# Patient Record
Sex: Female | Born: 1990 | Race: Black or African American | Hispanic: No | Marital: Single | Smoking: Never smoker
Health system: Southern US, Community
[De-identification: ages and names within clinical notes are randomized; demographics above are authoritative.]

## PROBLEM LIST (undated history)

## (undated) DIAGNOSIS — N390 Urinary tract infection, site not specified: Secondary | ICD-10-CM

## (undated) DIAGNOSIS — E669 Obesity, unspecified: Secondary | ICD-10-CM

## (undated) DIAGNOSIS — G43909 Migraine, unspecified, not intractable, without status migrainosus: Secondary | ICD-10-CM

## (undated) DIAGNOSIS — S60219A Contusion of unspecified wrist, initial encounter: Secondary | ICD-10-CM

## (undated) DIAGNOSIS — E66812 Obesity, class 2: Secondary | ICD-10-CM

## (undated) DIAGNOSIS — L639 Alopecia areata, unspecified: Secondary | ICD-10-CM

## (undated) DIAGNOSIS — M7660 Achilles tendinitis, unspecified leg: Secondary | ICD-10-CM

## (undated) DIAGNOSIS — L509 Urticaria, unspecified: Secondary | ICD-10-CM

## (undated) HISTORY — DX: Obesity, class 2: E66.812

## (undated) HISTORY — PX: WRIST SURGERY: SHX841

## (undated) HISTORY — DX: Obesity, unspecified: E66.9

## (undated) HISTORY — DX: Alopecia areata, unspecified: L63.9

## (undated) HISTORY — DX: Contusion of unspecified wrist, initial encounter: S60.219A

## (undated) HISTORY — DX: Achilles tendinitis, unspecified leg: M76.60

## (undated) HISTORY — PX: COLPOSCOPY: SHX161

## (undated) HISTORY — DX: Migraine, unspecified, not intractable, without status migrainosus: G43.909

## (undated) HISTORY — DX: Urinary tract infection, site not specified: N39.0

## (undated) HISTORY — DX: Urticaria, unspecified: L50.9

---

## 2017-11-01 DIAGNOSIS — Z87440 Personal history of urinary (tract) infections: Secondary | ICD-10-CM | POA: Insufficient documentation

## 2017-11-10 DIAGNOSIS — R87610 Atypical squamous cells of undetermined significance on cytologic smear of cervix (ASC-US): Secondary | ICD-10-CM | POA: Insufficient documentation

## 2019-01-15 ENCOUNTER — Emergency Department (HOSPITAL_BASED_OUTPATIENT_CLINIC_OR_DEPARTMENT_OTHER): Payer: 59

## 2019-01-15 ENCOUNTER — Emergency Department (HOSPITAL_BASED_OUTPATIENT_CLINIC_OR_DEPARTMENT_OTHER)
Admission: EM | Admit: 2019-01-15 | Discharge: 2019-01-15 | Disposition: A | Discharge status: Home / Self Care | Payer: 59 | Attending: Emergency Medicine | Admitting: Emergency Medicine

## 2019-01-15 ENCOUNTER — Encounter (HOSPITAL_BASED_OUTPATIENT_CLINIC_OR_DEPARTMENT_OTHER): Payer: Self-pay | Admitting: *Deleted

## 2019-01-15 ENCOUNTER — Other Ambulatory Visit: Payer: Self-pay

## 2019-01-15 DIAGNOSIS — M549 Dorsalgia, unspecified: Secondary | ICD-10-CM | POA: Diagnosis not present

## 2019-01-15 DIAGNOSIS — R0789 Other chest pain: Secondary | ICD-10-CM | POA: Insufficient documentation

## 2019-01-15 DIAGNOSIS — R202 Paresthesia of skin: Secondary | ICD-10-CM | POA: Diagnosis not present

## 2019-01-15 DIAGNOSIS — M25512 Pain in left shoulder: Secondary | ICD-10-CM | POA: Diagnosis not present

## 2019-01-15 DIAGNOSIS — R079 Chest pain, unspecified: Secondary | ICD-10-CM

## 2019-01-15 LAB — COMPREHENSIVE METABOLIC PANEL
ALT: 46 U/L — ABNORMAL HIGH (ref 0–44)
AST: 37 U/L (ref 15–41)
Albumin: 3.1 g/dL — ABNORMAL LOW (ref 3.5–5.0)
Alkaline Phosphatase: 106 U/L (ref 38–126)
Anion gap: 5 (ref 5–15)
BUN: 9 mg/dL (ref 6–20)
CO2: 27 mmol/L (ref 22–32)
Calcium: 8.5 mg/dL — ABNORMAL LOW (ref 8.9–10.3)
Chloride: 107 mmol/L (ref 98–111)
Creatinine, Ser: 0.64 mg/dL (ref 0.44–1.00)
GFR calc Af Amer: >60 mL/min (ref >60)
GFR calc non Af Amer: >60 mL/min (ref >60)
Glucose, Bld: 74 mg/dL (ref 70–99)
Potassium: 3.4 mmol/L — ABNORMAL LOW (ref 3.5–5.1)
Sodium: 139 mmol/L (ref 135–145)
Total Bilirubin: 0.7 mg/dL (ref 0.3–1.2)
Total Protein: 7.2 g/dL (ref 6.5–8.1)

## 2019-01-15 LAB — TROPONIN I (HIGH SENSITIVITY)
Troponin I (High Sensitivity): <2 ng/L (ref <18)
Troponin I (High Sensitivity): <2 ng/L (ref <18)

## 2019-01-15 LAB — CBC WITH DIFFERENTIAL/PLATELET
Abs Immature Granulocytes: 0.01 10*3/uL (ref 0.00–0.07)
Basophils Absolute: 0.1 10*3/uL (ref 0.0–0.1)
Basophils Relative: 1 %
Eosinophils Absolute: 0 10*3/uL (ref 0.0–0.5)
Eosinophils Relative: 1 %
HCT: 42.7 % (ref 36.0–46.0)
Hemoglobin: 13.4 g/dL (ref 12.0–15.0)
Immature Granulocytes: 0 %
Lymphocytes Relative: 54 %
Lymphs Abs: 3.2 10*3/uL (ref 0.7–4.0)
MCH: 27.8 pg (ref 26.0–34.0)
MCHC: 31.4 g/dL (ref 30.0–36.0)
MCV: 88.6 fL (ref 80.0–100.0)
Monocytes Absolute: 0.4 10*3/uL (ref 0.1–1.0)
Monocytes Relative: 7 %
Neutro Abs: 2.2 10*3/uL (ref 1.7–7.7)
Neutrophils Relative %: 37 %
Platelets: 337 10*3/uL (ref 150–400)
RBC: 4.82 MIL/uL (ref 3.87–5.11)
RDW: 14 % (ref 11.5–15.5)
WBC: 5.9 10*3/uL (ref 4.0–10.5)
nRBC: 0 % (ref 0.0–0.2)

## 2019-01-15 LAB — PREGNANCY, URINE: Preg Test, Ur: NEGATIVE

## 2019-01-15 MED ORDER — POTASSIUM CHLORIDE CRYS ER 20 MEQ PO TBCR: 20.0000 meq | EXTENDED_RELEASE_TABLET | Freq: Every day | ORAL | 0 refills | Status: DC

## 2019-01-15 MED ORDER — METHOCARBAMOL 500 MG PO TABS: 500.0000 mg | ORAL_TABLET | Freq: Three times a day (TID) | ORAL | 0 refills | Status: DC | PRN

## 2019-01-15 MED ORDER — NAPROXEN 500 MG PO TABS: 500.0000 mg | ORAL_TABLET | Freq: Two times a day (BID) | ORAL | 0 refills | Status: DC

## 2019-01-15 NOTE — ED Provider Notes (Signed)
[facility] EMERGENCY DEPARTMENT Provider Note   CSN: [id number] Arrival date & time: 01/15/19  1454     History Chief Complaint  Patient presents with  . Chest Pain    Regina Riley is a 28 y.o. female without significant past medical history who presents to the emergency department with complaints of chest pain that began at 1:30 PM.  Patient was sitting at work when she developed fairly quick onset stabbing pain to the left chest radiating to the left shoulder/left upper extremity with associated tingling to the left fingers.  States the left upper extremity felt abnormal in general.  She felt she did not want to move it.  No alleviating aggravating factors.  No change with a deep breath or with exertion.  She had an episode of similar discomfort 1 week ago during which the pain was more severe but the left upper extremity symptoms were more mild which prompted an ER visit.  Per review she was seen at [facility] emergency department 01/07/2019 for her symptoms.  CBC/CMP were performed with mildly elevated LFTs and mild hypocalcemia.  She had negative Covid testing and influenza testing.  Pregnancy test was negative.  Negative cardiac enzymes.  Positive D-dimer result with resultant CTA which was negative for pulmonary embolism or aortic dissection.  She was discharged home.  She denies dyspnea, nausea, vomiting, diaphoresis, lightheadedness, dizziness, syncope, facial asymmetry, slurred speech, lower extremity numbness/weakness, drug use, leg pain/swelling, hemoptysis, recent surgery/trauma, recent long travel,  personal hx of cancer, or hx of DVT/PE.  She does take an OCP.  She does not have any family history of early CAD.   HPI     History reviewed. No pertinent past medical history.  There are no problems to display for this patient.   History reviewed. No pertinent surgical history.   OB History   No obstetric history on file.     No family history on  file.  Social History   Tobacco Use  . Smoking status: Never Smoker  . Smokeless tobacco: Never Used  Substance Use Topics  . Alcohol use: Not Currently  . Drug use: Not Currently    Home Medications Prior to Admission medications   Not on File    Allergies    Patient has no known allergies.  Review of Systems   Review of Systems  Constitutional: Negative for chills, diaphoresis and fever.  HENT: Negative for congestion, ear pain and sore throat.   Eyes: Negative for visual disturbance.  Respiratory: Negative for cough and shortness of breath.        Negative for hemoptysis.   Cardiovascular: Positive for chest pain.  Gastrointestinal: Negative for abdominal pain, nausea and vomiting.  Musculoskeletal: Positive for arthralgias.  Neurological: Negative for dizziness, seizures, syncope, facial asymmetry, speech difficulty and headaches.       Positive for Left upper extremity paresthesias to the digits and and not feeling quite right per patient.  All other systems reviewed and are negative.   Physical Exam Updated Vital Signs BP 119/80 (BP Location: Left Arm)   Pulse 89   Temp 98.7 F (37.1 C)   Resp 18   Ht 5\' 6"  (1.676 m)   Wt 95.3 kg   LMP 01/09/2019   SpO2 100%   BMI 33.89 kg/m   Physical Exam Vitals and nursing note reviewed.  Constitutional:      General: She is not in acute distress.    Appearance: Normal appearance. She is well-developed. She is  not ill-appearing or toxic-appearing.  HENT:     Head: Normocephalic and atraumatic.  Eyes:     General:        Right eye: No discharge.        Left eye: No discharge.     Extraocular Movements: Extraocular movements intact.     Conjunctiva/sclera: Conjunctivae normal.     Pupils: Pupils are equal, round, and reactive to light.  Neck:     Comments: No midline tenderness.  Cardiovascular:     Rate and Rhythm: Normal rate and regular rhythm.     Pulses:          Radial pulses are 2+ on the right side  and 2+ on the left side.  Pulmonary:     Effort: Pulmonary effort is normal. No respiratory distress.     Breath sounds: Normal breath sounds. No wheezing, rhonchi or rales.  Chest:     Chest wall: Tenderness (Left chest wall) present.  Abdominal:     General: There is no distension.     Palpations: Abdomen is soft.     Tenderness: There is no abdominal tenderness.  Musculoskeletal:     Cervical back: Normal range of motion and neck supple. Muscular tenderness (left) present.     Right lower leg: No tenderness. No edema.     Left lower leg: No tenderness. No edema.     Comments: Upper extremities: No obvious deformity, appreciable swelling, edema, erythema, ecchymosis, warmth, or open wounds. Patient has intact AROM throughout.  Mild tenderness to the left trapezius and left deltoid region.  Skin:    General: Skin is warm and dry.     Capillary Refill: Capillary refill takes less than 2 seconds.     Findings: No rash.  Neurological:     Mental Status: She is alert.     Comments: Alert. Clear speech.  No facial droop.  CN III through XII grossly intact.  Sensation grossly intact to bilateral upper and lower extremities. 5/5 symmetric grip strength and strength with wrist flexion/extension, elbow flexion/extension, shoulder flexion/tension as well as ankle plantar/dorsiflexion.  Ambulatory.  Normal finger-to-nose.  Negative pronator drift.  Psychiatric:        Mood and Affect: Mood normal.        Behavior: Behavior normal.     ED Results / Procedures / Treatments   Labs (all labs ordered are listed, but only abnormal results are displayed) Labs Reviewed  CBC WITH DIFFERENTIAL/PLATELET  PREGNANCY, URINE  COMPREHENSIVE METABOLIC PANEL  TROPONIN I (HIGH SENSITIVITY)  TROPONIN I (HIGH SENSITIVITY)    EKG EKG Interpretation  Date/Time:  Monday January 15 2019 14:59:49 EST Ventricular Rate:  91 PR Interval:  148 QRS Duration: 72 QT Interval:  340 QTC Calculation: 418 R  Axis:   14 Text Interpretation: Sinus rhythm with marked sinus arrhythmia Minimal voltage criteria for LVH, may be normal variant ( R in aVL ) T wave abnormality, consider inferior ischemia Abnormal ECG No previous ECGs available Confirmed by Vanetta MuldersZackowski, Scott [phone]) on 01/15/2019 5:05:20 PM   Radiology DG Chest 2 View  Result Date: 01/15/2019 CLINICAL DATA:  28 year old female with chest pain. EXAM: CHEST - 2 VIEW COMPARISON:  None. FINDINGS: The lungs are clear. There is no pleural effusion or pneumothorax. The cardiac silhouette is within normal limits. No acute osseous pathology. IMPRESSION: No active cardiopulmonary disease. Electronically Signed   By: Elgie CollardArash  Radparvar M.D.   On: 01/15/2019 16:50   DG Shoulder Left  Result Date: 01/15/2019  CLINICAL DATA:  Chest pain left arm numbness EXAM: LEFT SHOULDER - 2+ VIEW COMPARISON:  None. FINDINGS: There is no evidence of fracture or dislocation. There is no evidence of arthropathy or other focal bone abnormality. Soft tissues are unremarkable. IMPRESSION: Negative. Electronically Signed   By: Marlan Palau M.D.   On: 01/15/2019 16:50    Procedures Procedures (including critical care time)  Medications Ordered in ED Medications - No data to display  ED Course  I have reviewed the triage vital signs and the nursing notes.  Pertinent labs & imaging results that were available during my care of the patient were reviewed by me and considered in my medical decision making (see chart for details).    MDM Rules/Calculators/A&P                      Patient presents to the emergency department with complaints of chest pain.  Patient is nontoxic-appearing, resting comfortably, vitals WNL.  Patient has some reproducibility of pain with left chest wall, trapezius, & deltoid palpation.  She reports some left finger paresthesias and abnormal feeling to the left upper extremity, she has a normal neurologic exam without focal deficits, do not suspect  acute CVA.  Plan for labs, cardiac monitor, and chest x-ray.  Pregnancy test: Negative CBC: No leukocytosis or anemia CMP: Mild hypocalcemia/hypokalemia- no significant derangement.  High-sensitivity troponin: Less than 2, less than 2 AYO:KHTXH rhythm with marked sinus arrhythmia Minimal voltage criteria for LVH, may be normal variant ( R in aVL ) T wave abnormality, consider inferior ischemia Abnormal ECG No previous ECGs available- no STEMI CXR/L shoulder xray: no acute abnormalities  Patient is low risk [place], she does not have a tearing sensation type pain, she has symmetric pulses, she has no neuro deficits, she had a recent CT angio which was negative for pulmonary embolism or dissection with a similar presentation, do not suspect either of these etiologies as the cause of her symptoms.  EKG without a STEMI, her delta troponin is without significant elevation, do not suspect ACS.  No imaging or laboratory abnormalities to explain her discomfort.  Unclear definitive etiology.  This may be muscular with reproducibility will trial muscle relaxant and naproxen, but unclear definitive etiology. She has an appointment with a cardiologist tomorrow- recommended keeping this for follow up.. I discussed results, treatment plan, need for follow-up, and return precautions with the patient. Provided opportunity for questions, patient confirmed understanding and is in agreement with plan.   Findings and plan of care discussed with supervising physician Dr. Deretha Emory who is in agreement.   Final Clinical Impression(s) / ED Diagnoses Final diagnoses:  Chest pain, unspecified type    Rx / DC Orders ED Discharge Orders         Ordered    naproxen (NAPROSYN) 500 MG tablet  2 times daily     01/15/19 1909    methocarbamol (ROBAXIN) 500 MG tablet  Every 8 hours PRN     01/15/19 1909    potassium chloride SA (KLOR-CON) 20 MEQ tablet  Daily     01/15/19 1909           Desmond Lope 01/15/19 1915    Vanetta Mulders, MD 01/18/19 [phone]

## 2019-01-15 NOTE — ED Triage Notes (Addendum)
C/o CP x 1 week , seen at [facility] ED last week for same , CTA neg

## 2019-01-15 NOTE — Discharge Instructions (Addendum)
You were seen in the emergency department today for chest pain. Your work-up in the emergency department has been overall reassuring. Your labs have been fairly normal and or similar to previous blood work you have had done, your potassium and calcium returned mildly low, please see attached diet guidelines, we are sending you home with a few days of a potassium supplement.. Your EKG and the enzyme we use to check your heart did not show an acute heart attack at this time. Your chest x-ray was normal.   We are unsure the exact cause of your chest discomfort.  We are sending you home with medicines to potentially help with a musculoskeletal cause.  - Naproxen is a nonsteroidal anti-inflammatory medication that will help with pain and swelling. Be sure to take this medication as prescribed with food, 1 pill every 12 hours,  It should be taken with food, as it can cause stomach upset, and more seriously, stomach bleeding. Do not take other nonsteroidal anti-inflammatory medications with this such as Advil, Motrin, Aleve, Mobic, Goodie Powder, or Motrin.    - Robaxin is the muscle relaxer I have prescribed, this is meant to help with muscle tightness. Be aware that this medication may make you drowsy therefore the first time you take this it should be at a time you are in an environment where you can rest. Do not drive or operate heavy machinery when taking this medication. Do not drink alcohol or take other sedating medications with this medicine such as narcotics or benzodiazepines.   You make take Tylenol per over the counter dosing with these medications.   We have prescribed you new medication(s) today. Discuss the medications prescribed today with your pharmacist as they can have adverse effects and interactions with your other medicines including over the counter and prescribed medications. Seek medical evaluation if you start to experience new or abnormal symptoms after taking one of these medicines,  seek care immediately if you start to experience difficulty breathing, feeling of your throat closing, facial swelling, or rash as these could be indications of a more serious allergic reaction  Please follow-up with the cardiologist appointment you have tomorrow as scheduled, we have also provided our cardiology group on-call.  Return to the ER immediately should you experience any new or worsening symptoms including but not limited to return of pain, worsened pain, vomiting, shortness of breath, dizziness, lightheadedness, passing out, or any other concerns that you may have.

## 2019-02-02 ENCOUNTER — Ambulatory Visit (INDEPENDENT_AMBULATORY_CARE_PROVIDER_SITE_OTHER): Payer: 59 | Admitting: Family Medicine

## 2019-02-02 ENCOUNTER — Encounter: Payer: Self-pay | Admitting: Family Medicine

## 2019-02-02 ENCOUNTER — Other Ambulatory Visit: Payer: Self-pay

## 2019-02-02 VITALS — BP 132/84 | HR 100 | Temp 97.6°F | Ht 66.0 in | Wt 218.0 lb

## 2019-02-02 DIAGNOSIS — R0789 Other chest pain: Secondary | ICD-10-CM | POA: Diagnosis not present

## 2019-02-02 LAB — CBC
HCT: 41.3 % (ref 36.0–46.0)
Hemoglobin: 13.5 g/dL (ref 12.0–15.0)
MCHC: 32.8 g/dL (ref 30.0–36.0)
MCV: 86.7 fl (ref 78.0–100.0)
Platelets: 369 10*3/uL (ref 150.0–400.0)
RBC: 4.76 Mil/uL (ref 3.87–5.11)
RDW: 14.3 % (ref 11.5–15.5)
WBC: 5.2 10*3/uL (ref 4.0–10.5)

## 2019-02-02 LAB — COMPREHENSIVE METABOLIC PANEL
ALT: 31 U/L (ref 0–35)
AST: 24 U/L (ref 0–37)
Albumin: 3.7 g/dL (ref 3.5–5.2)
Alkaline Phosphatase: 108 U/L (ref 39–117)
BUN: 11 mg/dL (ref 6–23)
CO2: 27 mEq/L (ref 19–32)
Calcium: 8.9 mg/dL (ref 8.4–10.5)
Chloride: 107 mEq/L (ref 96–112)
Creatinine, Ser: 0.6 mg/dL (ref 0.40–1.20)
GFR: 141.93 mL/min (ref >60.00)
Glucose, Bld: 82 mg/dL (ref 70–99)
Potassium: 3.8 mEq/L (ref 3.5–5.1)
Sodium: 142 mEq/L (ref 135–145)
Total Bilirubin: 0.7 mg/dL (ref 0.2–1.2)
Total Protein: 7 g/dL (ref 6.0–8.3)

## 2019-02-02 LAB — T4, FREE: Free T4: 0.89 ng/dL (ref 0.60–1.60)

## 2019-02-02 LAB — TSH: TSH: 1.81 u[IU]/mL (ref 0.35–4.50)

## 2019-02-02 MED ORDER — HYDROXYZINE HCL 25 MG PO TABS: 25.0000 mg | ORAL_TABLET | Freq: Three times a day (TID) | ORAL | 0 refills | Status: DC | PRN

## 2019-02-02 NOTE — Patient Instructions (Addendum)
Give [place] 2-3 business days to get the results of your labs back.   If you do not hear anything about your referral in the next 1-2 weeks, call our office and ask for an update.  Try heat, ice, and ibuprofen when/if this happens again. If this helps, this would suggest a musculoskeletal cause.  I am also sending in a medicine to take that would treat an anxiety/panic attack cause.  Let us know if you need anything.  Coping skills Choose 5 that work for you:  Take a deep breath  Count to 20  Read a book  Do a puzzle  Meditate  Bake  Sing  Knit  Garden  Pray  Go outside  Call a friend  Listen to music  Take a walk  Color  Send a note  Take a bath  Watch a movie  Be alone in a quiet place  Pet an animal  Visit a friend  Journal  Exercise  Stretch

## 2019-02-02 NOTE — Progress Notes (Signed)
Chief Complaint  Patient presents with  . New Patient (Initial Visit)       New Patient Visit SUBJECTIVE: HPI: Regina Riley is an 29 y.o.female who is being seen for establishing care.  The patient was seen by GYN only.   Chest pain:  Duration of issue: 4 months  L side of chest Most recent episode was 7 hrs that did not wax or wane earlier in the week Had 2 other episodes where she went to ED. CTA neg for PE, trops neg, EKG showed sinus arrhythmia, no other abn.  Went to see cards who told her nothing is wrong, no testing done.  Quality: sharp Palliation: none Provocation: none Severity: 10/10 when it happens Radiation: none Duration of chest pain: several hrs Associated symptoms: had L arm numbness once; BP and HR elevated No a/w meals, position, denies rashes/bruising/swelling. Cardiac history: none Family heart history: Yes, no early symptoms in family Smoker? No   No past medical history on file. No past surgical history on file. Family History  Problem Relation Age of Onset  . Asthma Mother   . Hypertension Father   . Hyperlipidemia Father   . Diabetes Father   . Heart attack Maternal Grandfather 60  . Heart disease Paternal Grandfather    No Known Allergies  Current Outpatient Medications:  .  norethindrone-ethinyl estradiol (LOESTRIN 1/20, 21,) 1-20 MG-MCG tablet, Take 1 tablet by mouth daily., Disp: , Rfl:  .  hydrOXYzine (ATARAX/VISTARIL) 25 MG tablet, Take 1-3 tablets (25-75 mg total) by mouth 3 (three) times daily as needed for anxiety., Disp: 60 tablet, Rfl: 0  Patient's last menstrual period was 01/31/2019 (exact date).  ROS Cardiovascular: Denies current chest pain  Respiratory: Denies dyspnea   OBJECTIVE: BP 132/84 (BP Location: Left Arm, Patient Position: Sitting, Cuff Size: Normal)   Pulse 100   Temp 97.6 F (36.4 C) (Temporal)   Ht 5' 6"  (1.676 m)   Wt 218 lb (98.9 kg)   LMP 01/31/2019 (Exact Date)   SpO2 96%   BMI 35.19 kg/m   General:  well developed, well nourished, in no apparent distress Skin:  no significant moles, warts, or growths Nose:  nares patent, septum midline, mucosa normal Throat/Pharynx:  lips and gingiva without lesion; tongue and uvula midline; non-inflamed pharynx; no exudates or postnasal drainage Lungs:  clear to auscultation, breath sounds equal bilaterally, no respiratory distress Cardio:  regular rate and rhythm, no LE edema or bruits Musculoskeletal:  Chest pain is not reproducible to palpation Neuro:  gait normal Psych: well oriented with normal range of affect and appropriate judgment/insight  ASSESSMENT/PLAN: Atypical chest pain - Plan: CBC, Comp Met (CMET), TSH, T4, free, Ambulatory referral to Cardiology, hydrOXYzine (ATARAX/VISTARIL) 25 MG tablet  Refer to cards for reassurance. The other office was going to do a stress test, but she did not want to continue to see them as she had a bad experience. Will set her up w a [facility] provider. Ck labs, hx of elevated Hb and hypoK. Famhx and personal medhx reassuring overall. Suggested she try some anxiety relieving techniques and Atarax should this happen again. Could also try ice/heat, Tylenol, ibuprofen.  Patient should return for CPE at convenience. The patient voiced understanding and agreement to the plan.   [place], DO 02/02/19  12:10 PM

## 2019-02-06 ENCOUNTER — Encounter: Payer: Self-pay | Admitting: Family Medicine

## 2019-02-07 NOTE — Progress Notes (Signed)
Cardiology Office Note:    Date:  02/08/2019   ID:  [name], DOB 02/16/1990, MRN [id number]  PCP:  Shelda Pal, DO  Cardiologist:  Shirlee More, MD   Referring MD: Shelda Pal*  ASSESSMENT:    1. Chest pain of uncertain etiology   2. Positive D dimer    PLAN:    In order of problems listed above:  1. 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  Further evaluation duplex lower extremities with elevated D-dimer and oral contraceptives initiation of a nonsteroidal anti-inflammatory drug and echocardiogram.  I will see back in the office in 1 month.  If she has evidence of DVT she need to stop oral contraceptives.  Next appointment 1 month   Medication Adjustments/Labs and Tests Ordered: Current medicines are reviewed at length with the patient today.  Concerns regarding medicines are outlined above.  No orders of the defined types were placed in this encounter.  No orders of the defined types were placed in this encounter.    Chief Complaint  Patient presents with  . Chest Pain    With 2 recent ED visits.    History of Present Illness:    Regina Riley is a 29 y.o. female who is being seen today for the evaluation of chest pain at the request of Shelda Pal*.  She was seen in the emergency room at [facility] 01/07/2019 and [place] 01/15/2019 both times for chest pain and both evaluation showed no evidence of acute coronary syndrome with initial and repeat troponins on each visit.  Seen by [facility] cardiology advised to have a stress echocardiogram on chart review she was seen previously in 2010 with chest pain in the ED at [place]   3 wk ago  (01/15/19) 3 wk ago  (01/15/19)   Troponin I (High Sensitivity) <18 ng/L <2  <2 CM    EKG 01/16/2019 [place] T wave abnormality K 3.4 EKG 01/07/2019 as similarly described I cannot personally review it in care  everywhere.  Her potassium 01/07/2019 was 4.5  Chest CTA 01/07/2019 performed with elevated D-dimer in the emergency room at [facility] Showed normal cardiovascular structures there is no aortic or coronary artery calcification noted no aortopathy. FINDINGS: No evidence for pulmonary embolism or aortic dissection. The trachea and mainstem bronchi are patent. The lung bases are remarkable for dependent bilateral lower lobe subsegmental atelectasis. Normal heart size. Trace left pleural effusion.  Negative upper abdomen.  SARS COVID 19 negative 01/07/2019.  Subsequently informed by office of a follow-up positive test more than 2 weeks ago not requiring hospitalization.  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  She has had 2 ED visits with chest pain that is localized left sternal area sharp nonexertional and relieved with rest lasting for hours both times and relieved when she took ibuprofen on her own.  She has had symptoms off and on for at least 3 months and was seen once in the emergency room 10 years ago with a similar phenomenon.  He has no shortness of breath she takes oral contraceptive her D-dimer is elevated she had a CTA of the chest but did not have her legs duplex she is concerned she could be at risk for deep vein thrombosis and will be performed and if abnormal would need to stop oral contraceptive therapy.  I do not think she requires an ischemia evaluation her risk  is quite low and what organ to do is an echocardiogram to exclude problems like pericarditis or structural heart disease initiate therapy with a nonsteroidal anti-inflammatory drug I told her I expect a prompt quick improvement and she continues to have symptoms physical therapy modalities with iontophoresis of high potency steroid can be quite helpful.  Her symptoms are not anginal she has no known history of heart disease congenital rheumatic or murmur.  Is not had  palpitation or syncope and despite the abnormal COVID-19 test has not had any respiratory symptoms fever or chills.  His pain was not pleuritic no associated GI symptoms or diaphoresis she did have paresthesia in her left arm with the second episode.  The chest pain was localized and sharp.  Past Medical History:  Diagnosis Date  . Achilles tendinitis   . Alopecia areata   . Obesity, Class II, BMI 35-39.9   . Urticaria   . UTI (urinary tract infection)   . Wrist contusion     Past Surgical History:  Procedure Laterality Date  . COLPOSCOPY    . WRIST SURGERY      Current Medications: Current Meds  Medication Sig  . hydrOXYzine (ATARAX/VISTARIL) 25 MG tablet Take 1-3 tablets (25-75 mg total) by mouth 3 (three) times daily as needed for anxiety.  . norethindrone-ethinyl estradiol (LOESTRIN 1/20, 21,) 1-20 MG-MCG tablet Take 1 tablet by mouth daily.     Allergies:   Patient has no known allergies.   Social History   Socioeconomic History  . Marital status: Single    Spouse name: Not on file  . Number of children: Not on file  . Years of education: Not on file  . Highest education level: Not on file  Occupational History  . Not on file  Tobacco Use  . Smoking status: Never Smoker  . Smokeless tobacco: Never Used  Substance and Sexual Activity  . Alcohol use: Not Currently  . Drug use: Not Currently  . Sexual activity: Not on file  Other Topics Concern  . Not on file  Social History Narrative  . Not on file   Social Determinants of Health   Financial Resource Strain:   . Difficulty of Paying Living Expenses: Not on file  Food Insecurity:   . Worried About Programme researcher, broadcasting/film/video in the Last Year: Not on file  . Ran Out of Food in the Last Year: Not on file  Transportation Needs:   . Lack of Transportation (Medical): Not on file  . Lack of Transportation (Non-Medical): Not on file  Physical Activity:   . Days of Exercise per Week: Not on file  . Minutes of Exercise  per Session: Not on file  Stress:   . Feeling of Stress : Not on file  Social Connections:   . Frequency of Communication with Friends and Family: Not on file  . Frequency of Social Gatherings with Friends and Family: Not on file  . Attends Religious Services: Not on file  . Active Member of Clubs or Organizations: Not on file  . Attends Banker Meetings: Not on file  . Marital Status: Not on file     Family History: The patient's family history includes Asthma in her mother; Diabetes in her father; Heart attack (age of onset: 3) in her maternal grandfather; Heart disease in her paternal grandfather; Hyperlipidemia in her father; Hypertension in her father.  ROS:   Review of Systems  Constitution: Negative.  HENT: Negative.   Eyes: Negative.  Cardiovascular: Positive for chest pain.  Respiratory: Negative.   Endocrine: Negative.   Hematologic/Lymphatic: Negative.   Skin: Negative.   Musculoskeletal: Negative.   Gastrointestinal: Negative.   Genitourinary: Negative.   Neurological: Positive for paresthesias.  Psychiatric/Behavioral: Negative.   Allergic/Immunologic: Negative.    Please see the history of present illness.     All other systems reviewed and are negative.  EKGs/Labs/Other Studies Reviewed:    The following studies were reviewed today: Reviewed extensive to ED visits and ED visit in 2010 with the patient during the visit  EKG:  EKG is  ordered today.  The ekg ordered today is personally reviewed and demonstrates unspecific T wave  Recent Labs: 02/02/2019: ALT 31; BUN 11; Creatinine, Ser 0.60; Hemoglobin 13.5; Platelets 369.0; Potassium 3.8; Sodium 142; TSH 1.81  Recent Lipid Panel 11/01/2017 cholesterol 160 triglyceride 45 LDL 108 HDL 43  Physical Exam:    VS:  BP 108/78   Pulse 71   Ht 5\' 6"  (1.676 m)   Wt 216 lb (98 kg)   LMP 01/31/2019 (Exact Date)   SpO2 96%   BMI 34.86 kg/m     Wt Readings from Last 3 Encounters:  02/08/19 216 lb  (98 kg)  02/02/19 218 lb (98.9 kg)  01/15/19 210 lb (95.3 kg)     GEN:  Well nourished, well developed in no acute distress HEENT: Normal NECK: No JVD; No carotid bruits LYMPHATICS: No lymphadenopathy CARDIAC: Mild left costochondral junction tenderness to exam RRR, no murmurs, rubs, gallops RESPIRATORY:  Clear to auscultation without rales, wheezing or rhonchi  ABDOMEN: Soft, non-tender, non-distended MUSCULOSKELETAL:  No edema; No deformity  SKIN: Warm and dry NEUROLOGIC:  Alert and oriented x 3 PSYCHIATRIC:  Normal affect     Signed, 01/17/19, MD  02/08/2019 10:10 AM    [facility]

## 2019-02-08 ENCOUNTER — Encounter: Payer: Self-pay | Admitting: Cardiology

## 2019-02-08 ENCOUNTER — Ambulatory Visit (INDEPENDENT_AMBULATORY_CARE_PROVIDER_SITE_OTHER): Payer: 59 | Admitting: Cardiology

## 2019-02-08 ENCOUNTER — Other Ambulatory Visit: Payer: Self-pay

## 2019-02-08 VITALS — BP 108/78 | HR 71 | Ht 66.0 in | Wt 216.0 lb

## 2019-02-08 DIAGNOSIS — R079 Chest pain, unspecified: Secondary | ICD-10-CM

## 2019-02-08 DIAGNOSIS — R7989 Other specified abnormal findings of blood chemistry: Secondary | ICD-10-CM | POA: Diagnosis not present

## 2019-02-08 MED ORDER — CELECOXIB 100 MG PO CAPS: 100.0000 mg | ORAL_CAPSULE | Freq: Two times a day (BID) | ORAL | 2 refills | Status: DC

## 2019-02-08 NOTE — Patient Instructions (Signed)
Medication Instructions:  Start: Celebrex 100 MG Twice daily with food for 2 weeks. Then take 1 capsule as needed.   *If you need a refill on your cardiac medications before your next appointment, please call your pharmacy*  Lab Work: None  If you have labs (blood work) drawn today and your tests are completely normal, you will receive your results only by: Marland Kitchen MyChart Message (if you have MyChart) OR . A paper copy in the mail If you have any lab test that is abnormal or we need to change your treatment, we will call you to review the results.  Testing/Procedures: Your physician has requested that you have an echocardiogram. Echocardiography is a painless test that uses sound waves to create images of your heart. It provides your doctor with information about the size and shape of your heart and how well your heart's chambers and valves are working. This procedure takes approximately one hour. There are no restrictions for this procedure.  You will be scheduled for a bilateral vascular ultrasound of the lower extremity.    Follow-Up: At [facility], you and your health needs are our priority.  As part of our continuing mission to provide you with exceptional heart care, we have created designated Provider Care Teams.  These Care Teams include your primary Cardiologist (physician) and Advanced Practice Providers (APPs -  Physician Assistants and Nurse Practitioners) who all work together to provide you with the care you need, when you need it.  Your next appointment:   4 week(s)  The format for your next appointment:   In Person  Provider:   Norman Herrlich, MD  Other Instructions None

## 2019-02-15 ENCOUNTER — Ambulatory Visit (HOSPITAL_BASED_OUTPATIENT_CLINIC_OR_DEPARTMENT_OTHER)
Admission: RE | Admit: 2019-02-15 | Discharge: 2019-02-15 | Disposition: A | Discharge status: Home / Self Care | Payer: 59 | Source: Ambulatory Visit | Attending: Cardiology | Admitting: Cardiology

## 2019-02-15 ENCOUNTER — Other Ambulatory Visit: Payer: Self-pay

## 2019-02-15 DIAGNOSIS — R7989 Other specified abnormal findings of blood chemistry: Secondary | ICD-10-CM | POA: Diagnosis not present

## 2019-02-15 DIAGNOSIS — R079 Chest pain, unspecified: Secondary | ICD-10-CM

## 2019-02-15 NOTE — Progress Notes (Signed)
  Echocardiogram 2D Echocardiogram has been performed.  Regina Riley 02/15/2019, 10:16 AM

## 2019-02-15 NOTE — Progress Notes (Signed)
VAS [place] LOWER EXTREMITY VENOUS BILAT (DVT)    02/15/19 Sinda Du RDCS, RVT

## 2019-03-07 NOTE — Progress Notes (Signed)
Cardiology Office Note:    Date:  03/08/2019   ID:  Regina Riley, DOB [birth year], MRN [id number]  PCP:  Shelda Pal, DO  Cardiologist:  Shirlee More, MD    Referring MD: Shelda Pal*    ASSESSMENT:    1. Chest pain of uncertain etiology   2. Positive D dimer    PLAN:    In order of problems listed above:  1. Clinically she had costochondral pain syndrome resolved with nonsteroidal she can stop taking Celebrex she can use a as needed or over-the-counter Aleve or ibuprofen.  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 Elevated D-dimer is not associated with venous thromboembolism. 3. Infrequent palpitation, encouraged her to purchase the iPhone adapter and if she captures arrhythmia to send it to me in my chart   Next appointment: As needed   Medication Adjustments/Labs and Tests Ordered: Current medicines are reviewed at length with the patient today.  Concerns regarding medicines are outlined above.  No orders of the defined types were placed in this encounter.  No orders of the defined types were placed in this encounter.   Chief Complaint  Patient presents with  . Follow-up    After testing    History of Present Illness:    Regina Riley is a 29 y.o. female with a hx of chest pain last seen 02/08/2019.  Her chest pain appeared to be costochondral musculoskeletal and was treated with a nonsteroidal anti-inflammatory drug.. Compliance with diet, lifestyle and medications: Yes  Lower extremity venous duplex study performed 02/15/2019 showed no evidence of deep vein or superficial thrombophlebitis in either lower extremity.  Echocardiogram 02/15/2019 showed normal left ventricular right ventricular size and function there is no significant valvular abnormality and the pericardium is normal.  Fortunately she is markedly improved with Celebrex chest pain is resolved.  She is reassured by the findings of lower extremity duplex  because she had an elevated D-dimer and the echocardiogram is reassuring.  She had one episode where her heart was rapid heart rate of 130 and she took Vistaril and it resolved.  She does not think it was emotionally induced.  We talked about applying an event monitor but it is rare that she has episodes I encouraged her if she is having them to purchase the iPhone adapter and she could send strips to me through my chart.  I am just hesitant to more order more testing on her now that the problem she saw me for is resolved she is not having chest pain shortness of breath or syncope Past Medical History:  Diagnosis Date  . Achilles tendinitis   . Alopecia areata   . Obesity, Class II, BMI 35-39.9   . Urticaria   . UTI (urinary tract infection)   . Wrist contusion     Past Surgical History:  Procedure Laterality Date  . COLPOSCOPY    . WRIST SURGERY      Current Medications: Current Meds  Medication Sig  . celecoxib (CELEBREX) 100 MG capsule Take 1 capsule (100 mg total) by mouth 2 (two) times daily. Take 1 capsule (100 MG total) by mouth 2 (two) times daily with food for 2 weeks. Then take 1 capsule (100 MG total) as needed.  . hydrOXYzine (ATARAX/VISTARIL) 25 MG tablet Take 1-3 tablets (25-75 mg total) by mouth 3 (three) times daily as needed for anxiety.  . norethindrone-ethinyl estradiol (LOESTRIN 1/20, 21,) 1-20 MG-MCG tablet Take 1 tablet by mouth daily.  Allergies:   Patient has no known allergies.   Social History   Socioeconomic History  . Marital status: Single    Spouse name: Not on file  . Number of children: Not on file  . Years of education: Not on file  . Highest education level: Not on file  Occupational History  . Not on file  Tobacco Use  . Smoking status: Never Smoker  . Smokeless tobacco: Never Used  Substance and Sexual Activity  . Alcohol use: Not Currently  . Drug use: Not Currently  . Sexual activity: Yes    Birth control/protection: Pill  Other  Topics Concern  . Not on file  Social History Narrative  . Not on file   Social Determinants of Health   Financial Resource Strain:   . Difficulty of Paying Living Expenses: Not on file  Food Insecurity:   . Worried About Programme researcher, broadcasting/film/video in the Last Year: Not on file  . Ran Out of Food in the Last Year: Not on file  Transportation Needs:   . Lack of Transportation (Medical): Not on file  . Lack of Transportation (Non-Medical): Not on file  Physical Activity:   . Days of Exercise per Week: Not on file  . Minutes of Exercise per Session: Not on file  Stress:   . Feeling of Stress : Not on file  Social Connections:   . Frequency of Communication with Friends and Family: Not on file  . Frequency of Social Gatherings with Friends and Family: Not on file  . Attends Religious Services: Not on file  . Active Member of Clubs or Organizations: Not on file  . Attends Banker Meetings: Not on file  . Marital Status: Not on file     Family History: The patient's family history includes Asthma in her mother; Diabetes in her father; Heart attack (age of onset: 104) in her maternal grandfather; Heart disease in her paternal grandfather; Hyperlipidemia in her father; Hypertension in her father. ROS:   Please see the history of present illness.    All other systems reviewed and are negative.  EKGs/Labs/Other Studies Reviewed:    The following studies were reviewed today:   Recent Labs: 02/02/2019: ALT 31; BUN 11; Creatinine, Ser 0.60; Hemoglobin 13.5; Platelets 369.0; Potassium 3.8; Sodium 142; TSH 1.81  Recent Lipid Panel No results found for: CHOL, TRIG, HDL, CHOLHDL, VLDL, LDLCALC, LDLDIRECT  Physical Exam:    VS:  BP 104/68   Pulse 80   Temp 97.7 F (36.5 C)   Ht 5\' 6"  (1.676 m)   Wt 219 lb (99.3 kg)   SpO2 98%   BMI 35.35 kg/m     Wt Readings from Last 3 Encounters:  03/08/19 219 lb (99.3 kg)  02/08/19 216 lb (98 kg)  02/02/19 218 lb (98.9 kg)     GEN:   Well nourished, well developed in no acute distress HEENT: Normal NECK: No JVD; No carotid bruits LYMPHATICS: No lymphadenopathy CARDIAC: Chest wall tenderness RRR, no murmurs, rubs, gallops RESPIRATORY:  Clear to auscultation without rales, wheezing or rhonchi  ABDOMEN: Soft, non-tender, non-distended MUSCULOSKELETAL:  No edema; No deformity  SKIN: Warm and dry NEUROLOGIC:  Alert and oriented x 3 PSYCHIATRIC:  Normal affect    Signed, 04/02/19, MD  03/08/2019 10:30 AM    [facility]

## 2019-03-08 ENCOUNTER — Encounter: Payer: Self-pay | Admitting: Cardiology

## 2019-03-08 ENCOUNTER — Ambulatory Visit (INDEPENDENT_AMBULATORY_CARE_PROVIDER_SITE_OTHER): Payer: 59 | Admitting: Cardiology

## 2019-03-08 ENCOUNTER — Other Ambulatory Visit: Payer: Self-pay

## 2019-03-08 VITALS — BP 104/68 | HR 80 | Temp 97.7°F | Ht 66.0 in | Wt 219.0 lb

## 2019-03-08 DIAGNOSIS — R079 Chest pain, unspecified: Secondary | ICD-10-CM

## 2019-03-08 DIAGNOSIS — R7989 Other specified abnormal findings of blood chemistry: Secondary | ICD-10-CM

## 2019-03-08 NOTE — Patient Instructions (Addendum)
KardiaMobile [email or web address]        FDA-cleared, clinical grade mobile EKG monitor: Lourena Simmonds is the most clinically-validated mobile EKG used by the world's leading cardiac care medical professionals With Basic service, know instantly if your heart rhythm is normal or if atrial fibrillation is detected, and email the last single EKG recording to yourself or your doctor Premium service, available for purchase through the Kardia app for $9.99 per month or $99 per year, includes unlimited history and storage of your EKG recordings, a monthly EKG summary report to share with your doctor, along with the ability to track your blood pressure, activity and weight Includes one KardiaMobile phone clip FREE SHIPPING: Standard delivery 1-3 business days. Orders placed by 11:00am PST will ship that afternoon. Otherwise, will ship next business day. All orders ship via [employer] from [place], [place]    Your physician recommends that you schedule a follow-up appointment in: AS NEEDED CALL IF RAPID HR    Your physician recommends that you continue on your current medications as directed. Please refer to the Current Medication list given to you today. MAY TAKE CELEBREX  AS NEEDED

## 2019-04-25 ENCOUNTER — Other Ambulatory Visit: Payer: Self-pay

## 2019-04-26 ENCOUNTER — Ambulatory Visit (INDEPENDENT_AMBULATORY_CARE_PROVIDER_SITE_OTHER): Payer: 59 | Admitting: Family Medicine

## 2019-04-26 ENCOUNTER — Encounter: Payer: Self-pay | Admitting: Family Medicine

## 2019-04-26 ENCOUNTER — Other Ambulatory Visit: Payer: Self-pay

## 2019-04-26 VITALS — BP 120/80 | HR 72 | Temp 95.7°F | Ht 66.0 in | Wt 219.4 lb

## 2019-04-26 DIAGNOSIS — Z111 Encounter for screening for respiratory tuberculosis: Secondary | ICD-10-CM | POA: Diagnosis not present

## 2019-04-26 DIAGNOSIS — Z0000 Encounter for general adult medical examination without abnormal findings: Secondary | ICD-10-CM

## 2019-04-26 DIAGNOSIS — Z0184 Encounter for antibody response examination: Secondary | ICD-10-CM

## 2019-04-26 LAB — COMPREHENSIVE METABOLIC PANEL
ALT: 29 U/L (ref 0–35)
AST: 21 U/L (ref 0–37)
Albumin: 3.6 g/dL (ref 3.5–5.2)
Alkaline Phosphatase: 98 U/L (ref 39–117)
BUN: 12 mg/dL (ref 6–23)
CO2: 25 mEq/L (ref 19–32)
Calcium: 8.6 mg/dL (ref 8.4–10.5)
Chloride: 107 mEq/L (ref 96–112)
Creatinine, Ser: 0.61 mg/dL (ref 0.40–1.20)
GFR: 139.04 mL/min (ref >60.00)
Glucose, Bld: 81 mg/dL (ref 70–99)
Potassium: 4.1 mEq/L (ref 3.5–5.1)
Sodium: 140 mEq/L (ref 135–145)
Total Bilirubin: 0.5 mg/dL (ref 0.2–1.2)
Total Protein: 6.6 g/dL (ref 6.0–8.3)

## 2019-04-26 LAB — CBC
HCT: 42.7 % (ref 36.0–46.0)
Hemoglobin: 14 g/dL (ref 12.0–15.0)
MCHC: 32.8 g/dL (ref 30.0–36.0)
MCV: 88 fl (ref 78.0–100.0)
Platelets: 379 10*3/uL (ref 150.0–400.0)
RBC: 4.85 Mil/uL (ref 3.87–5.11)
RDW: 14.3 % (ref 11.5–15.5)
WBC: 6.3 10*3/uL (ref 4.0–10.5)

## 2019-04-26 LAB — LIPID PANEL
Cholesterol: 173 mg/dL (ref 0–200)
HDL: 43.2 mg/dL (ref >39.00)
LDL Cholesterol: 122 mg/dL — ABNORMAL HIGH (ref 0–99)
NonHDL: 130.02
Total CHOL/HDL Ratio: 4
Triglycerides: 38 mg/dL (ref 0.0–149.0)
VLDL: 7.6 mg/dL (ref 0.0–40.0)

## 2019-04-26 NOTE — Patient Instructions (Addendum)
Give [place] 2-3 business days to get the results of your labs back.   Keep the diet clean and stay active.  We will notify you when your forms are ready.  Let us know if you need anything.

## 2019-04-26 NOTE — Progress Notes (Signed)
Chief Complaint  Patient presents with  . Annual Exam     Well Woman Regina Riley is here for a complete physical.   Her last physical was >1 year ago.  Current diet: in general, diet is OK. Current exercise: some walking. Contraception? Yes Seatbelt? Yes  Health Maintenance Pap/HPV- No Tetanus- Yes HIV screening- Yes  Past Medical History:  Diagnosis Date  . Achilles tendinitis   . Alopecia areata   . Obesity, Class II, BMI 35-39.9   . Urticaria   . UTI (urinary tract infection)   . Wrist contusion      Past Surgical History:  Procedure Laterality Date  . COLPOSCOPY    . WRIST SURGERY     Medications  Current Outpatient Medications on File Prior to Visit  Medication Sig Dispense Refill  . celecoxib (CELEBREX) 100 MG capsule Take 1 capsule (100 mg total) by mouth 2 (two) times daily. Take 1 capsule (100 MG total) by mouth 2 (two) times daily with food for 2 weeks. Then take 1 capsule (100 MG total) as needed. 90 capsule 2  . hydrOXYzine (ATARAX/VISTARIL) 25 MG tablet Take 1-3 tablets (25-75 mg total) by mouth 3 (three) times daily as needed for anxiety. 60 tablet 0  . nitrofurantoin, macrocrystal-monohydrate, (MACROBID) 100 MG capsule Take 100 mg by mouth 2 (two) times daily.    . norethindrone-ethinyl estradiol (LOESTRIN 1/20, 21,) 1-20 MG-MCG tablet Take 1 tablet by mouth daily.     Allergies No Known Allergies  Review of Systems: Constitutional:  no unexpected weight changes Eye:  no recent significant change in vision Ear/Nose/Mouth/Throat:  Ears:  no tinnitus or vertigo and no recent change in hearing Nose/Mouth/Throat:  no complaints of nasal congestion, no sore throat Cardiovascular: no chest pain Respiratory:  no cough and no shortness of breath Gastrointestinal:  no abdominal pain, no change in bowel habits GU:  Female: Getting over UTI, feeling better on abx Musculoskeletal/Extremities:  no pain of the joints Integumentary (Skin/Breast):  no  abnormal skin lesions reported Neurologic:  no headaches Endocrine:  denies fatigue Hematologic/Lymphatic:  No areas of easy bleeding  Exam BP 120/80 (BP Location: Left Arm, Patient Position: Sitting, Cuff Size: Normal)   Pulse 72   Temp (!) 95.7 F (35.4 C) (Temporal)   Ht 5\' 6"  (1.676 m)   Wt 219 lb 6 oz (99.5 kg)   SpO2 98%   BMI 35.41 kg/m  General:  well developed, well nourished, in no apparent distress Skin:  no significant moles, warts, or growths Head:  no masses, lesions, or tenderness Eyes:  pupils equal and round, sclera anicteric without injection Ears:  canals without lesions, TMs shiny without retraction, no obvious effusion, no erythema Nose:  nares patent, septum midline, mucosa normal, and no drainage or sinus tenderness Throat/Pharynx:  lips and gingiva without lesion; tongue and uvula midline; non-inflamed pharynx; no exudates or postnasal drainage Neck: neck supple without adenopathy, thyromegaly, or masses Lungs:  clear to auscultation, breath sounds equal bilaterally, no respiratory distress Cardio:  regular rate and rhythm, no bruits, no LE edema Abdomen:  abdomen soft, nontender; bowel sounds normal; no masses or organomegaly Genital: Defer to GYN Musculoskeletal:  symmetrical muscle groups noted without atrophy or deformity Extremities:  no clubbing, cyanosis, or edema, no deformities, no skin discoloration Neuro:  gait normal; deep tendon reflexes normal and symmetric Psych: well oriented with normal range of affect and appropriate judgment/insight  Assessment and Plan  Well adult exam - Plan: CBC, Comprehensive metabolic panel,  Lipid panel  Screening-pulmonary TB - Plan: QuantiFERON-TB Gold Plus  Immunity status testing - Plan: Measles/Mumps/Rubella Immunity, Varicella zoster antibody, IgG   Well 29 y.o. female. Counseled on diet and exercise. Other orders as above. Follow up in 1 yr or prn. The patient voiced understanding and agreement to the  plan.  [place], DO 04/26/19 8:52 AM

## 2019-04-28 LAB — QUANTIFERON-TB GOLD PLUS
Mitogen-NIL: >10 IU/mL
NIL: 0.05 IU/mL
QuantiFERON-TB Gold Plus: NEGATIVE
TB1-NIL: 0.01 IU/mL
TB2-NIL: 0.02 IU/mL

## 2019-04-28 LAB — MEASLES/MUMPS/RUBELLA IMMUNITY
Mumps IgG: 81.6 AU/mL
Rubella: 1.28 Index
Rubeola IgG: 102 AU/mL

## 2019-04-28 LAB — VARICELLA ZOSTER ANTIBODY, IGG: Varicella IgG: 1189 index

## 2019-05-01 ENCOUNTER — Telehealth: Payer: Self-pay | Admitting: Family Medicine

## 2019-05-01 NOTE — Telephone Encounter (Signed)
PCP completed paperwork for school [employer] and Health Screening form for work ) Copied and sent to scan Called the patient on 05/01/2019 and she will pickup original at our office on 05/02/19. Put in envelope and is at the front desk ready for pickup

## 2019-05-03 NOTE — Telephone Encounter (Signed)
Pt came in office to pick up document, pt verified documents and stated page 4 is missing info to be completed and also test date is missing physician area. Pt would like to be called when ready again at [phone]. Document at front desk Annice Pih), will give to Provider on 05-04-2019 (provider not in office today 05-03-2019). Please advise.

## 2019-05-04 NOTE — Telephone Encounter (Signed)
Called pt to inform document completed, pt will come today to pick up document. Document at front office.

## 2019-11-28 ENCOUNTER — Encounter: Payer: Self-pay | Admitting: Family Medicine

## 2019-11-28 ENCOUNTER — Ambulatory Visit (INDEPENDENT_AMBULATORY_CARE_PROVIDER_SITE_OTHER): Payer: 59 | Admitting: Family Medicine

## 2019-11-28 ENCOUNTER — Other Ambulatory Visit: Payer: Self-pay

## 2019-11-28 VITALS — BP 110/72 | HR 98 | Temp 99.3°F | Ht 66.0 in | Wt 224.1 lb

## 2019-11-28 DIAGNOSIS — F411 Generalized anxiety disorder: Secondary | ICD-10-CM

## 2019-11-28 DIAGNOSIS — G43009 Migraine without aura, not intractable, without status migrainosus: Secondary | ICD-10-CM

## 2019-11-28 MED ORDER — VENLAFAXINE HCL ER 37.5 MG PO CP24: 37.5000 mg | ORAL_CAPSULE | Freq: Every day | ORAL | 3 refills | Status: DC

## 2019-11-28 MED ORDER — SUMATRIPTAN SUCCINATE 100 MG PO TABS: 100.0000 mg | ORAL_TABLET | ORAL | 2 refills | Status: DC | PRN

## 2019-11-28 NOTE — Progress Notes (Signed)
Chief Complaint  Patient presents with  . Migraine    Regina Riley is a 29 y.o. female here for evaluation of migraines.  Duration: 20 years- worsened over last few weeks Palliation: sometimes sleep, ibuprofen Provocation: stress Severity: 8/10 Quality: sharp. Associated symptoms: nausea, sonophobia, photophobia Denies: Gum chewing, jaw pain, injury, vomiting, diplopia, vertigo, tinnitus, ataxia Sometimes sleep helps, uses ibuprofen.  Currently with headache? No Failed therapies: she was on Maxalt, unsure how helpful it was  She has been having more stress over the past several mo. She is not following with a counselor or psychologist. No SI or HI. No self-medication. Work serves as a Paediatric nurse.   Past Medical History:  Diagnosis Date  . Achilles tendinitis   . Alopecia areata   . Obesity, Class II, BMI 35-39.9   . Urticaria   . UTI (urinary tract infection)   . Wrist contusion    Family History  Problem Relation Age of Onset  . Asthma Mother   . Hypertension Father   . Hyperlipidemia Father   . Diabetes Father   . Heart attack Maternal Grandfather 60  . Heart disease Paternal Grandfather    Current Meds  Medication Sig  . metroNIDAZOLE (FLAGYL) 250 MG tablet Take 250 mg by mouth 2 (two) times daily as needed.  . norethindrone-ethinyl estradiol (LOESTRIN 1/20, 21,) 1-20 MG-MCG tablet Take 1 tablet by mouth daily.  . [DISCONTINUED] nitrofurantoin, macrocrystal-monohydrate, (MACROBID) 100 MG capsule Take 100 mg by mouth 2 (two) times daily.    BP 110/72 (BP Location: Left Arm, Patient Position: Sitting, Cuff Size: Normal)   Pulse 98   Temp 99.3 F (37.4 C) (Oral)   Ht 5\' 6"  (1.676 m)   Wt 224 lb 2 oz (101.7 kg)   SpO2 95%   BMI 36.17 kg/m  General: awake, alert, appearing stated age Eyes: PERRLA, EOMi Heart: RRR, no murmurs, no bruits Lungs: CTAB, no accessory muscle use Neuro: CN 2-12 intact, no cerebellar signs, DTR's equal and symmetry, no  clonus MSK: 5/5 strength throughout, normal gait, no TTP over posterior cervical triangle or paraspinal cervical musculature Psych: Age appropriate judgment and insight, mood and affect normal  Migraine without aura and without status migrainosus, not intractable - Plan: SUMAtriptan (IMITREX) 100 MG tablet  GAD (generalized anxiety disorder) - Plan: venlafaxine XR (EFFEXOR XR) 37.5 MG 24 hr capsule  1. Status: Chronic, uncontrolled. Reorder Imitrex. She is on OCP.  2. Start Effexor. This may help with her headaches in addition to her palpitations. [facility] info given. Anxiety coping techniques verbalized and written down.  Follow up in 4 weeks. The patient voiced understanding and agreement to the plan.  [facility] [place], [place] 4:45 PM 11/28/19

## 2019-11-28 NOTE — Patient Instructions (Signed)
OK to take Tylenol 1000 mg (2 extra strength tabs) or 975 mg (3 regular strength tabs) every 6 hours as needed.  Ibuprofen 400-600 mg (2-3 over the counter strength tabs) every 6 hours as needed for pain.  Aim to do some physical exertion for 150 minutes per week. This is typically divided into 5 days per week, 30 minutes per day. The activity should be enough to get your heart rate up. Anything is better than nothing if you have time constraints.  Please consider counseling. Contact [phone] to schedule an appointment or inquire about cost/insurance coverage.  Coping skills Choose 5 that work for you:  Take a deep breath  Count to 20  Read a book  Do a puzzle  Meditate  Bake  Sing  Knit  Garden  Pray  Go outside  Call a friend  Listen to music  Take a walk  Color  Send a note  Take a bath  Watch a movie  Be alone in a quiet place  Pet an animal  Visit a friend  Journal  Exercise  Stretch

## 2019-12-27 ENCOUNTER — Ambulatory Visit (INDEPENDENT_AMBULATORY_CARE_PROVIDER_SITE_OTHER): Payer: 59 | Admitting: Family Medicine

## 2019-12-27 ENCOUNTER — Other Ambulatory Visit: Payer: Self-pay

## 2019-12-27 ENCOUNTER — Encounter: Payer: Self-pay | Admitting: Family Medicine

## 2019-12-27 VITALS — BP 112/76 | HR 85 | Temp 97.8°F | Resp 15 | Wt 225.8 lb

## 2019-12-27 DIAGNOSIS — G43009 Migraine without aura, not intractable, without status migrainosus: Secondary | ICD-10-CM | POA: Diagnosis not present

## 2019-12-27 MED ORDER — ONDANSETRON 4 MG PO TBDP: 4.0000 mg | ORAL_TABLET | Freq: Three times a day (TID) | ORAL | 0 refills | Status: DC | PRN

## 2019-12-27 MED ORDER — VENLAFAXINE HCL ER 75 MG PO CP24: 75.0000 mg | ORAL_CAPSULE | Freq: Every day | ORAL | 3 refills | Status: DC

## 2019-12-27 MED ORDER — RIZATRIPTAN BENZOATE 10 MG PO TABS: 10.0000 mg | ORAL_TABLET | ORAL | 2 refills | Status: DC | PRN

## 2019-12-27 NOTE — Progress Notes (Signed)
Chief Complaint  Patient presents with  . Follow-up    medications    Regina Riley is a 29 y.o. female here for evaluation of R sided, sometimes posterior headache.  The patient was seen around 1 month ago and placed on Imitrex as needed for migraines in addition to starting Effexor XR 37.5 mg daily.  She reports the Imitrex would really help with her headaches.  Effexor did decrease the frequency of the headaches by about 30-40%.  It did not decrease in severity.  She tolerated the medicine well and reports compliance.  She does not currently have a headache.  No new signs or symptoms.  Past Medical History:  Diagnosis Date  . Achilles tendinitis   . Alopecia areata   . Obesity, Class II, BMI 35-39.9   . Urticaria   . UTI (urinary tract infection)   . Wrist contusion    Family History  Problem Relation Age of Onset  . Asthma Mother   . Hypertension Father   . Hyperlipidemia Father   . Diabetes Father   . Heart attack Maternal Grandfather 60  . Heart disease Paternal Grandfather    Current Meds  Medication Sig  . metroNIDAZOLE (FLAGYL) 250 MG tablet Take 250 mg by mouth 2 (two) times daily as needed.  . norethindrone-ethinyl estradiol (LOESTRIN 1/20, 21,) 1-20 MG-MCG tablet Take 1 tablet by mouth daily.  . [DISCONTINUED] SUMAtriptan (IMITREX) 100 MG tablet Take 1 tablet (100 mg total) by mouth every 2 (two) hours as needed for migraine. May repeat in 2 hours if headache persists or recurs.  . [DISCONTINUED] venlafaxine XR (EFFEXOR XR) 37.5 MG 24 hr capsule Take 1 capsule (37.5 mg total) by mouth daily with breakfast.    BP 112/76 (BP Location: Left Arm, Patient Position: Sitting, Cuff Size: Large)   Pulse 85   Temp 97.8 F (36.6 C) (Temporal)   Resp 15   Wt 225 lb 12.8 oz (102.4 kg)   LMP 12/12/2019   SpO2 97%   BMI 36.45 kg/m  General: awake, alert, appearing stated age Eyes: PERRLA, EOMi Heart: RRR, no murmurs, no bruits Lungs: CTAB, no accessory muscle  use Neuro: CN 2-12 intact, no cerebellar signs, DTR's equal and symmetry, no clonus MSK: 5/5 strength throughout, normal gait, no TTP over posterior cervical triangle or paraspinal cervical musculature Psych: Age appropriate judgment and insight, mood and affect normal  Migraine without aura and without status migrainosus, not intractable - Plan: venlafaxine XR (EFFEXOR-XR) 75 MG 24 hr capsule, rizatriptan (MAXALT) 10 MG tablet, ondansetron (ZOFRAN-ODT) 4 MG disintegrating tablet  Status: Uncontrolled, we will stop Imitrex and start Maxalt.  Zofran as needed for nausea associated with her headaches.  We will increase the dose of Effexor XR from 37.5 mg daily to 75 mg daily. Follow up in 4 weeks. The patient voiced understanding and agreement to the plan.  Jilda Roche [place], [place] 4:44 PM 12/27/19

## 2019-12-27 NOTE — Patient Instructions (Signed)
Take 2 tabs of your Effexor until you run out. A new dosage has been sent.  Use the Zofran as needed for nausea.  Stop using the Imitrex and start on the Maxalt.   Let us know if you need anything.

## 2020-01-28 ENCOUNTER — Ambulatory Visit: Payer: 59 | Admitting: Family Medicine

## 2020-01-28 ENCOUNTER — Other Ambulatory Visit: Payer: Self-pay

## 2020-01-28 ENCOUNTER — Encounter: Payer: Self-pay | Admitting: Family Medicine

## 2020-01-28 VITALS — BP 119/72 | HR 60 | Temp 98.8°F | Resp 18 | Ht 66.0 in | Wt 227.6 lb

## 2020-01-28 DIAGNOSIS — G43009 Migraine without aura, not intractable, without status migrainosus: Secondary | ICD-10-CM | POA: Diagnosis not present

## 2020-01-28 MED ORDER — VENLAFAXINE HCL ER 75 MG PO CP24: 75.0000 mg | ORAL_CAPSULE | Freq: Every day | ORAL | 2 refills | Status: DC

## 2020-01-28 NOTE — Progress Notes (Signed)
Chief Complaint  Patient presents with  . Follow-up    Unchanged , no recent migraines     Regina Riley is a 30 y.o. female here for evaluation of migraines.  Current therapy: Effexor XR 75 mg/d, Zofran and Maxalt prn. No AE's. Controlled. 2 headaches in past mo. Does not wish to adjust medications. No current headache.   Past Medical History:  Diagnosis Date  . Achilles tendinitis   . Alopecia areata   . Obesity, Class II, BMI 35-39.9   . Urticaria   . UTI (urinary tract infection)   . Wrist contusion    Family History  Problem Relation Age of Onset  . Asthma Mother   . Hypertension Father   . Hyperlipidemia Father   . Diabetes Father   . Heart attack Maternal Grandfather 60  . Heart disease Paternal Grandfather    Current Meds  Medication Sig  . norethindrone-ethinyl estradiol (LOESTRIN) 1-20 MG-MCG tablet Take 1 tablet by mouth daily.  . ondansetron (ZOFRAN-ODT) 4 MG disintegrating tablet Take 1 tablet (4 mg total) by mouth every 8 (eight) hours as needed for nausea or vomiting.  . rizatriptan (MAXALT) 10 MG tablet Take 1 tablet (10 mg total) by mouth as needed for migraine. May repeat in 2 hours if needed  . [DISCONTINUED] venlafaxine XR (EFFEXOR-XR) 75 MG 24 hr capsule Take 1 capsule (75 mg total) by mouth daily with breakfast.    BP 119/72   Pulse 60   Temp 98.8 F (37.1 C) (Oral)   Resp 18   Ht 5\' 6"  (1.676 m)   Wt 227 lb 9.6 oz (103.2 kg)   SpO2 100%   BMI 36.74 kg/m  General: awake, alert, appearing stated age Eyes: PERRLA, EOMi Lungs: no accessory muscle use Neuro: CN 2-12 intact, no cerebellar signs, DTR's equal and symmetry, no clonus, 5/5 strength throughout Psych: Age appropriate judgment and insight, mood and affect normal  Migraine without aura and without status migrainosus, not intractable - Plan: venlafaxine XR (EFFEXOR-XR) 75 MG 24 hr capsule  Cont Effexor XR 75 mg/d, Zofran 4 mg prn nausea, Maxalt 10 mg prn headaches.  Follow up in  6 mo for CPE or prn. The patient voiced understanding and agreement to the plan.  [place], [place] 7:45 AM 01/28/20

## 2020-01-28 NOTE — Patient Instructions (Signed)
Let's stay on this trio. Let me know if/when you need refills.  Let us know if you need anything.

## 2020-02-22 ENCOUNTER — Other Ambulatory Visit: Payer: Self-pay

## 2020-02-22 ENCOUNTER — Telehealth: Payer: Self-pay

## 2020-02-22 ENCOUNTER — Telehealth (INDEPENDENT_AMBULATORY_CARE_PROVIDER_SITE_OTHER): Payer: 59 | Admitting: Physician Assistant

## 2020-02-22 DIAGNOSIS — U071 COVID-19: Secondary | ICD-10-CM

## 2020-02-22 MED ORDER — PREDNISONE 20 MG PO TABS: 20.0000 mg | ORAL_TABLET | Freq: Two times a day (BID) | ORAL | 0 refills | Status: AC

## 2020-02-22 MED ORDER — ALBUTEROL SULFATE HFA 108 (90 BASE) MCG/ACT IN AERS: 2.0000 | INHALATION_SPRAY | Freq: Four times a day (QID) | RESPIRATORY_TRACT | 0 refills | Status: DC | PRN

## 2020-02-22 NOTE — Patient Instructions (Signed)
You have COVID-19 and this may take time to recover from this illness. Please keep [place] updated on your symptoms and consider long COVID clinic if persistent more than several weeks. ER this weekend for acutely worsening symptoms.

## 2020-02-22 NOTE — Telephone Encounter (Signed)
Pt seen by Alyssa today.

## 2020-02-22 NOTE — Progress Notes (Signed)
Virtual Visit via Video Note  I connected with Regina Riley on 02/22/20 at 10:00 AM EST by a video enabled telemedicine application and verified that I am speaking with the correct person using two identifiers.  Location: Patient: Home Provider: Office   I discussed the limitations of evaluation and management by telemedicine and the availability of in person appointments. The patient expressed understanding and agreed to proceed. Only the patient and myself were present for today's video call.   History of Present Illness:  Patient is a 30 year old female presenting today via video call for Covid-19 symptoms.  She states she tested positive on February 17, 2020.  She continues to have complaints of chest congestion, sore throat, and cough so bad that it "makes her urinate on herself." She had her COVID booster on 02/13/20, symptoms started soon after that. 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 She is currently taking vitamins, Tylenol Cold and Flu, and trying to stay hydrated. She is keeping upright and walking as well. She has been having some episodes of mild wheezing and SOB when she is walking upstairs.    Observations/Objective:  SpO2 - 96%; HR 105 Gen: Awake, alert, no acute distress Resp: Breathing is even and non-labored Psych: calm/pleasant demeanor Neuro: Alert and Oriented x 3, + facial symmetry, speech is clear.   Assessment and Plan:  COVID-19 -patient tested positive for COVID-19 5 days prior to our visit today.  She is having overall mild, usual symptoms for this illness.  Her pulse ox has been normal staying above 90% and she has been checking this daily.  I will add prednisone and a rescue inhaler for some added relief, along with measures she has been doing at home.  She is to continue self-isolation at home.  She will follow CDC guidelines and work guidelines.  She will call if she needs a note from [place] at all.  Patient will go  to the ER right away for any oxygen saturation less than 92%, acute shortness of breath, acute chest pain or worsening symptoms.  Follow Up Instructions:    I discussed the assessment and treatment plan with the patient. The patient was provided an opportunity to ask questions and all were answered. The patient agreed with the plan and demonstrated an understanding of the instructions.   The patient was advised to call back or seek an in-person evaluation if the symptoms worsen or if the condition fails to improve as anticipated.  Jeannette Maddy M Jairo [place], PA-C

## 2020-02-22 NOTE — Telephone Encounter (Signed)
Nurse Assessment Nurse: Regina Barre, RN, Miranda Date/Time (Eastern Time): 02/22/2020 7:14:02 AM Confirm and document reason for call. If symptomatic, describe symptoms. ---Caller states she has a cough, wheezing, and sore throat. Symptoms started over the weekend. She tested positive for COVID on Sunday. Does the patient have any new or worsening symptoms? ---Yes Will a triage be completed? ---Yes Related visit to physician within the last 2 weeks? ---No Does the PT have any chronic conditions? (i.e. diabetes, asthma, this includes High risk factors for pregnancy, etc.) ---No Is the patient pregnant or possibly pregnant? (Ask all females between the ages of 23-55) ---No Is this a behavioral health or substance abuse call? ---No Guidelines Guideline Title Affirmed Question Affirmed Notes Nurse Date/Time (Eastern Time) COVID-19 - Diagnosed or Suspected MILD difficulty breathing (e.g., minimal/no SOB at rest, SOB with walking, pulse <100) Regina Barre, RN, Miranda 02/22/2020 7:15:48 AM Disp. Time Lamount Cohen Time) Disposition Final User 02/22/2020 7:13:05 AM Send to Urgent Queue Darrel Hoover 02/22/2020 7:18:55 AM See HCP within 4 Hours (or PCP triage) Yes Regina Barre, RN, Miranda PLEASE NOTE: All timestamps contained within this report are represented as [place] Standard Time. CONFIDENTIALTY NOTICE: This fax transmission is intended only for the addressee. It contains information that is legally privileged, confidential or otherwise protected from use or disclosure. If you are not the intended recipient, you are strictly prohibited from reviewing, disclosing, copying using or disseminating any of this information or taking any action in reliance on or regarding this information. If you have received this fax in error, please notify us immediately by telephone so that we can arrange for its return to [place]. Phone: [phone], Toll-Free: [phone], Fax: [phone] Page: 2 of 2 Call Id:  [id number] Caller Disagree/Comply Comply Caller Understands Yes PreDisposition Did not know what to do Care Advice Given Per Guideline SEE HCP (OR PCP TRIAGE) WITHIN 4 HOURS: * IF OFFICE WILL BE OPEN: You need to be seen within the next 3 or 4 hours. Call your doctor (or NP/PA) now or as soon as the office opens. GENERAL CARE ADVICE FOR COVID-19 SYMPTOMS: * The symptoms are generally treated the same whether you have COVID-19, influenza or some other respiratory virus. * Cough: Use cough drops. * Feeling dehydrated: Drink extra liquids. If the air in your home is dry, use a humidifier. CALL BACK IF: * You become worse CARE ADVICE given per COVID-19 - DIAGNOSED OR SUSPECTED (Adult) guideline. Referrals REFERRED TO PCP OFFICE

## 2020-02-22 NOTE — Telephone Encounter (Signed)
Needs virtual visit please.  

## 2020-03-14 ENCOUNTER — Other Ambulatory Visit: Payer: Self-pay

## 2020-03-14 ENCOUNTER — Encounter: Payer: Self-pay | Admitting: Family Medicine

## 2020-03-14 ENCOUNTER — Ambulatory Visit: Payer: 59 | Admitting: Family Medicine

## 2020-03-14 VITALS — BP 102/62 | HR 85 | Temp 98.6°F | Ht 66.0 in | Wt 231.1 lb

## 2020-03-14 DIAGNOSIS — R0609 Other forms of dyspnea: Secondary | ICD-10-CM

## 2020-03-14 DIAGNOSIS — U099 Post covid-19 condition, unspecified: Secondary | ICD-10-CM

## 2020-03-14 MED ORDER — BENZONATATE 100 MG PO CAPS: 100.0000 mg | ORAL_CAPSULE | Freq: Three times a day (TID) | ORAL | 0 refills | Status: DC | PRN

## 2020-03-14 MED ORDER — FLOVENT HFA 110 MCG/ACT IN AERO: 2.0000 | INHALATION_SPRAY | Freq: Two times a day (BID) | RESPIRATORY_TRACT | 1 refills | Status: DC

## 2020-03-14 NOTE — Progress Notes (Signed)
Chief Complaint  Patient presents with  . Shortness of Breath    Still having SOB since having Covid  . Cough    Subjective: Patient is a 30 y.o. female here for sob.  Dx'd w covid on 1/23. DOE and coughing since then. Getting slightly worse. Has tried Mucinex and Tylenol cold and flu. Some associated wheezing. No asthma or COPD, nonsmoker. She is coughing at night. Heat/warmth seems to flare things. Denies fevers, N/V/D, ST, ear pain, itchy eyes, runny/stuffy nose, sinus pain, myalgias.  Past Medical History:  Diagnosis Date  . Achilles tendinitis   . Alopecia areata   . Obesity, Class II, BMI 35-39.9   . Urticaria   . UTI (urinary tract infection)   . Wrist contusion     Objective: BP 102/62 (BP Location: Left Arm, Patient Position: Sitting, Cuff Size: Normal)   Pulse 85   Temp 98.6 F (37 C) (Oral)   Ht 5\' 6"  (1.676 m)   Wt 231 lb 2 oz (104.8 kg)   SpO2 96%   BMI 37.30 kg/m  General: Awake, appears stated age HEENT: MMM, EOMi, pharynx neg, ears neg, nares patent w/o dc Heart: RRR Lungs: CTAB, no rales, wheezes or rhonchi. No accessory muscle use Psych: Age appropriate judgment and insight, normal affect and mood  Assessment and Plan: Post-COVID chronic dyspnea - Plan: fluticasone (FLOVENT HFA) 110 MCG/ACT inhaler, benzonatate (TESSALON) 100 MG capsule  Tessalon Perles prn cough. ICS bid for several weeks to decrease inflammation in lungs. Rinse mouth out after use. F/u in 2-3 weeks if no better. Will consider chest imaging and LABA vs LAMA. The patient voiced understanding and agreement to the plan.  [place], DO 03/14/20  11:30 AM

## 2020-03-14 NOTE — Patient Instructions (Signed)
Rinse your mouth out after use.  Let us know if you need anything.

## 2020-05-02 ENCOUNTER — Ambulatory Visit: Payer: 59 | Admitting: Family Medicine

## 2020-08-01 ENCOUNTER — Ambulatory Visit (INDEPENDENT_AMBULATORY_CARE_PROVIDER_SITE_OTHER): Payer: 59 | Admitting: Family Medicine

## 2020-08-01 ENCOUNTER — Encounter: Payer: Self-pay | Admitting: Family Medicine

## 2020-08-01 ENCOUNTER — Other Ambulatory Visit: Payer: Self-pay

## 2020-08-01 VITALS — BP 120/70 | HR 84 | Temp 98.8°F | Ht 65.0 in | Wt 236.1 lb

## 2020-08-01 DIAGNOSIS — Z1159 Encounter for screening for other viral diseases: Secondary | ICD-10-CM

## 2020-08-01 DIAGNOSIS — Z0000 Encounter for general adult medical examination without abnormal findings: Secondary | ICD-10-CM

## 2020-08-01 LAB — COMPREHENSIVE METABOLIC PANEL
ALT: 25 U/L (ref 0–35)
AST: 21 U/L (ref 0–37)
Albumin: 3.6 g/dL (ref 3.5–5.2)
Alkaline Phosphatase: 121 U/L — ABNORMAL HIGH (ref 39–117)
BUN: 8 mg/dL (ref 6–23)
CO2: 26 mEq/L (ref 19–32)
Calcium: 8.7 mg/dL (ref 8.4–10.5)
Chloride: 108 mEq/L (ref 96–112)
Creatinine, Ser: 0.59 mg/dL (ref 0.40–1.20)
GFR: 119.94 mL/min (ref >60.00)
Glucose, Bld: 88 mg/dL (ref 70–99)
Potassium: 3.9 mEq/L (ref 3.5–5.1)
Sodium: 141 mEq/L (ref 135–145)
Total Bilirubin: 0.8 mg/dL (ref 0.2–1.2)
Total Protein: 6.7 g/dL (ref 6.0–8.3)

## 2020-08-01 LAB — CBC
HCT: 44.8 % (ref 36.0–46.0)
Hemoglobin: 14.6 g/dL (ref 12.0–15.0)
MCHC: 32.5 g/dL (ref 30.0–36.0)
MCV: 85.7 fl (ref 78.0–100.0)
Platelets: 414 10*3/uL — ABNORMAL HIGH (ref 150.0–400.0)
RBC: 5.23 Mil/uL — ABNORMAL HIGH (ref 3.87–5.11)
RDW: 14.7 % (ref 11.5–15.5)
WBC: 7.1 10*3/uL (ref 4.0–10.5)

## 2020-08-01 LAB — LIPID PANEL
Cholesterol: 155 mg/dL (ref 0–200)
HDL: 41.3 mg/dL (ref >39.00)
LDL Cholesterol: 106 mg/dL — ABNORMAL HIGH (ref 0–99)
NonHDL: 113.24
Total CHOL/HDL Ratio: 4
Triglycerides: 34 mg/dL (ref 0.0–149.0)
VLDL: 6.8 mg/dL (ref 0.0–40.0)

## 2020-08-01 LAB — HEMOGLOBIN A1C: Hgb A1c MFr Bld: 5.5 % (ref 4.6–6.5)

## 2020-08-01 NOTE — Progress Notes (Signed)
Chief Complaint  Patient presents with   Annual Exam     Well Woman Regina Riley is here for a complete physical.   Her last physical was >1 year ago.  Current diet: in general, diet could be better. Current exercise: walking. Contraception? Yes Fatigue out of ordinary? No Seatbelt? Yes  Health Maintenance Pap/HPV- Yes Tetanus- Yes HIV screening- Yes STI screening- No  Past Medical History:  Diagnosis Date   Alopecia areata    Migraine    Obesity, Class II, BMI 35-39.9    Urticaria      Past Surgical History:  Procedure Laterality Date   COLPOSCOPY     WRIST SURGERY      Medications  Current Outpatient Medications on File Prior to Visit  Medication Sig Dispense Refill   albuterol (VENTOLIN HFA) 108 (90 Base) MCG/ACT inhaler Inhale 2 puffs into the lungs every 6 (six) hours as needed for wheezing or shortness of breath. 8 g 0   benzonatate (TESSALON) 100 MG capsule Take 1 capsule (100 mg total) by mouth 3 (three) times daily as needed. 30 capsule 0   fluticasone (FLOVENT HFA) 110 MCG/ACT inhaler Inhale 2 puffs into the lungs in the morning and at bedtime. Rinse mouth out after use. 1 each 1   norethindrone-ethinyl estradiol (LOESTRIN) 1-20 MG-MCG tablet Take 1 tablet by mouth daily.     ondansetron (ZOFRAN-ODT) 4 MG disintegrating tablet Take 1 tablet (4 mg total) by mouth every 8 (eight) hours as needed for nausea or vomiting. 20 tablet 0   rizatriptan (MAXALT) 10 MG tablet Take 1 tablet (10 mg total) by mouth as needed for migraine. May repeat in 2 hours if needed 10 tablet 2   venlafaxine XR (EFFEXOR-XR) 75 MG 24 hr capsule Take 1 capsule (75 mg total) by mouth daily with breakfast. 90 capsule 2    Allergies No Known Allergies  Review of Systems: Constitutional:  no unexpected weight changes Eye:  no recent significant change in vision Ear/Nose/Mouth/Throat:  Ears:  no tinnitus or vertigo and no recent change in hearing Nose/Mouth/Throat:  no complaints of  nasal congestion, no sore throat Cardiovascular: no chest pain Respiratory:  no cough and no shortness of breath Gastrointestinal:  no abdominal pain, no change in bowel habits GU:  Female: negative for dysuria or pelvic pain Musculoskeletal/Extremities:  no pain of the joints Integumentary (Skin/Breast):  no abnormal skin lesions reported Neurologic:  no headaches Endocrine:  denies fatigue Hematologic/Lymphatic:  No areas of easy bleeding  Exam BP 120/70   Pulse 84   Temp 98.8 F (37.1 C) (Oral)   Ht 5\' 5"  (1.651 m)   Wt 236 lb 2 oz (107.1 kg)   SpO2 98%   BMI 39.29 kg/m  General:  well developed, well nourished, in no apparent distress Skin:  no significant moles, warts, or growths Head:  no masses, lesions, or tenderness Eyes:  pupils equal and round, sclera anicteric without injection Ears:  canals without lesions, TMs shiny without retraction, no obvious effusion, no erythema Nose:  nares patent, septum midline, mucosa normal, and no drainage or sinus tenderness Throat/Pharynx:  lips and gingiva without lesion; tongue and uvula midline; non-inflamed pharynx; no exudates or postnasal drainage Neck: neck supple without adenopathy, thyromegaly, or masses Lungs:  clear to auscultation, breath sounds equal bilaterally, no respiratory distress Cardio:  regular rate and rhythm, no bruits, no LE edema Abdomen:  abdomen soft, nontender; bowel sounds normal; no masses or organomegaly Genital: Defer to GYN Musculoskeletal:  symmetrical muscle groups noted without atrophy or deformity Extremities:  no clubbing, cyanosis, or edema, no deformities, no skin discoloration Neuro:  gait normal; deep tendon reflexes normal and symmetric Psych: well oriented with normal range of affect and appropriate judgment/insight  Assessment and Plan  Well adult exam - Plan: CBC, Comprehensive metabolic panel, Lipid panel, Hemoglobin A1c, Nicotine/cotinine metabolites( [facility])  Encounter for hepatitis C screening test for low risk patient - Plan: Hepatitis C antibody   Well 30 y.o. female. Counseled on diet and exercise. Needs A1c and nicotine for work.  Other orders as above. Follow up in 6 mo or prn. The patient voiced understanding and agreement to the plan.  Jilda Roche [place], DO 08/01/20 7:46 AM

## 2020-08-01 NOTE — Patient Instructions (Signed)
Give us 2-3 business days to get the results of your labs back.   Keep the diet clean and stay active.  Let us know if you need anything. 

## 2020-08-04 LAB — HEPATITIS C ANTIBODY
Hepatitis C Ab: NONREACTIVE
SIGNAL TO CUT-OFF: 0.01 (ref <1.00)

## 2020-08-06 ENCOUNTER — Encounter: Payer: Self-pay | Admitting: Family Medicine

## 2020-08-06 ENCOUNTER — Other Ambulatory Visit: Payer: Self-pay | Admitting: Family Medicine

## 2020-08-06 DIAGNOSIS — R7989 Other specified abnormal findings of blood chemistry: Secondary | ICD-10-CM

## 2020-08-08 LAB — NICOTINE/COTININE METABOLITES
Cotinine: <1 ng/mL
Nicotine: <1 ng/mL

## 2020-09-04 ENCOUNTER — Other Ambulatory Visit: Payer: Self-pay

## 2020-09-04 ENCOUNTER — Other Ambulatory Visit (INDEPENDENT_AMBULATORY_CARE_PROVIDER_SITE_OTHER): Payer: 59

## 2020-09-04 DIAGNOSIS — R7989 Other specified abnormal findings of blood chemistry: Secondary | ICD-10-CM

## 2020-09-04 LAB — HEPATIC FUNCTION PANEL
ALT: 32 U/L (ref 0–35)
AST: 22 U/L (ref 0–37)
Albumin: 3.5 g/dL (ref 3.5–5.2)
Alkaline Phosphatase: 128 U/L — ABNORMAL HIGH (ref 39–117)
Bilirubin, Direct: 0.1 mg/dL (ref 0.0–0.3)
Total Bilirubin: 0.7 mg/dL (ref 0.2–1.2)
Total Protein: 6.5 g/dL (ref 6.0–8.3)

## 2020-09-05 ENCOUNTER — Other Ambulatory Visit (INDEPENDENT_AMBULATORY_CARE_PROVIDER_SITE_OTHER): Payer: 59

## 2020-09-05 DIAGNOSIS — R7989 Other specified abnormal findings of blood chemistry: Secondary | ICD-10-CM

## 2020-09-05 LAB — GAMMA GT: GGT: 53 U/L — ABNORMAL HIGH (ref 7–51)

## 2020-09-10 ENCOUNTER — Other Ambulatory Visit: Payer: Self-pay | Admitting: Family Medicine

## 2020-09-10 DIAGNOSIS — R748 Abnormal levels of other serum enzymes: Secondary | ICD-10-CM

## 2020-09-10 LAB — ALKALINE PHOSPHATASE, ISOENZYMES: Alkaline Phosphatase: 149 IU/L — ABNORMAL HIGH (ref 44–121)

## 2020-09-17 ENCOUNTER — Ambulatory Visit (HOSPITAL_BASED_OUTPATIENT_CLINIC_OR_DEPARTMENT_OTHER)
Admission: RE | Admit: 2020-09-17 | Discharge: 2020-09-17 | Disposition: A | Discharge status: Home / Self Care | Payer: 59 | Source: Ambulatory Visit | Attending: Family Medicine | Admitting: Family Medicine

## 2020-09-17 ENCOUNTER — Other Ambulatory Visit: Payer: Self-pay

## 2020-09-17 DIAGNOSIS — R748 Abnormal levels of other serum enzymes: Secondary | ICD-10-CM | POA: Diagnosis not present

## 2020-09-18 ENCOUNTER — Other Ambulatory Visit: Payer: Self-pay | Admitting: Family Medicine

## 2020-09-18 DIAGNOSIS — R748 Abnormal levels of other serum enzymes: Secondary | ICD-10-CM

## 2020-09-26 ENCOUNTER — Telehealth: Payer: Self-pay | Admitting: Family Medicine

## 2020-09-26 ENCOUNTER — Other Ambulatory Visit: Payer: Self-pay

## 2020-09-26 ENCOUNTER — Other Ambulatory Visit (INDEPENDENT_AMBULATORY_CARE_PROVIDER_SITE_OTHER): Payer: 59

## 2020-09-26 DIAGNOSIS — R748 Abnormal levels of other serum enzymes: Secondary | ICD-10-CM

## 2020-09-26 NOTE — Telephone Encounter (Signed)
Form completed Copied for scan Called the patient left message form is completed and at the front desk.

## 2020-09-26 NOTE — Telephone Encounter (Signed)
Pt dropped off form to be filled out  Placed in wendlings bin up front  Pt would like to be called when its ready to b picked up

## 2020-10-01 LAB — ALKALINE PHOSPHATASE ISOENZYMES
Alkaline phosphatase (APISO): 135 U/L — ABNORMAL HIGH (ref 31–125)
Bone Isoenzymes: 30 % (ref 28–66)
Intestinal Isoenzymes: 0 % — ABNORMAL LOW (ref 1–24)
Liver Isoenzymes: 70 % — ABNORMAL HIGH (ref 25–69)
Macrohepatic isoenzymes: 0 % (ref <=0)
Placental isoenzymes: 0 % (ref <=0)

## 2020-11-14 ENCOUNTER — Telehealth: Payer: Self-pay | Admitting: Family Medicine

## 2020-11-14 NOTE — Telephone Encounter (Signed)
Pt. Called in and wanted to see if her form could be faxed. Faxed completed form for pt. In front incase she ever needs it.

## 2020-11-17 NOTE — Telephone Encounter (Signed)
Faxed form/completed

## 2021-02-03 ENCOUNTER — Ambulatory Visit: Payer: 59 | Admitting: Family Medicine

## 2021-02-03 ENCOUNTER — Encounter: Payer: Self-pay | Admitting: Family Medicine

## 2021-02-03 VITALS — BP 120/70 | HR 71 | Temp 99.0°F | Ht 65.0 in | Wt 233.2 lb

## 2021-02-03 DIAGNOSIS — G43009 Migraine without aura, not intractable, without status migrainosus: Secondary | ICD-10-CM | POA: Diagnosis not present

## 2021-02-03 MED ORDER — VENLAFAXINE HCL ER 75 MG PO CP24: 75.0000 mg | ORAL_CAPSULE | Freq: Every day | ORAL | 2 refills | Status: DC

## 2021-02-03 MED ORDER — RIZATRIPTAN BENZOATE 10 MG PO TABS: 10.0000 mg | ORAL_TABLET | ORAL | 2 refills | Status: DC | PRN

## 2021-02-03 NOTE — Progress Notes (Signed)
Chief Complaint  Patient presents with   Follow-up    Subjective: Patient is a 31 y.o. female here for Migraine f/u.  Taking Effexor XR 75 mg/d. Reports compliance and no AE's. In the last 3-4 weeks she feels her headaches have been getting more HA's. She has been in front a computer more often. She has an appt w her eye dr in 3 weeks. Gets associated blurry vision.   Past Medical History:  Diagnosis Date   Alopecia areata    Migraine    Obesity, Class II, BMI 35-39.9    Urticaria     Objective: BP 120/70    Pulse 71    Temp 99 F (37.2 C) (Oral)    Ht 5\' 5"  (1.651 m)    Wt 233 lb 4 oz (105.8 kg)    SpO2 98%    BMI 38.81 kg/m  General: Awake, appears stated age MSK: No ttp in cerv muscle or subocc triangle Neuro: DTR's equal and symmetric throughout Lungs: No accessory muscle use Psych: Age appropriate judgment and insight, normal affect and mood  Assessment and Plan: Migraine without aura and without status migrainosus, not intractable - Plan: venlafaxine XR (EFFEXOR-XR) 75 MG 24 hr capsule, rizatriptan (MAXALT) 10 MG tablet  Chronic, stable. Cont Maxalt 10 mg prn, Effexor XR 75 mg/d. If not improving w eye appt plan, will reach out. Consider blue light filtering glasses for work. F/u in 6 mo for CPE or prn.   The patient voiced understanding and agreement to the plan.  [place], DO 02/03/21  7:54 AM

## 2021-02-03 NOTE — Patient Instructions (Signed)
Consider blue light filtering glasses. These shouldn't be terribly expensive.   Let us know if you need anything.

## 2021-08-04 ENCOUNTER — Encounter: Payer: Self-pay | Admitting: Family Medicine

## 2021-08-04 ENCOUNTER — Ambulatory Visit (INDEPENDENT_AMBULATORY_CARE_PROVIDER_SITE_OTHER): Payer: 59 | Admitting: Family Medicine

## 2021-08-04 VITALS — BP 108/72 | HR 63 | Temp 98.5°F | Ht 66.0 in | Wt 239.1 lb

## 2021-08-04 DIAGNOSIS — Z0000 Encounter for general adult medical examination without abnormal findings: Secondary | ICD-10-CM

## 2021-08-04 DIAGNOSIS — Z23 Encounter for immunization: Secondary | ICD-10-CM

## 2021-08-04 LAB — CBC
HCT: 41.9 % (ref 36.0–46.0)
Hemoglobin: 13.4 g/dL (ref 12.0–15.0)
MCHC: 32 g/dL (ref 30.0–36.0)
MCV: 86.6 fl (ref 78.0–100.0)
Platelets: 382 10*3/uL (ref 150.0–400.0)
RBC: 4.83 Mil/uL (ref 3.87–5.11)
RDW: 14.9 % (ref 11.5–15.5)
WBC: 6.3 10*3/uL (ref 4.0–10.5)

## 2021-08-04 LAB — COMPREHENSIVE METABOLIC PANEL
ALT: 50 U/L — ABNORMAL HIGH (ref 0–35)
AST: 45 U/L — ABNORMAL HIGH (ref 0–37)
Albumin: 3.4 g/dL — ABNORMAL LOW (ref 3.5–5.2)
Alkaline Phosphatase: 128 U/L — ABNORMAL HIGH (ref 39–117)
BUN: 11 mg/dL (ref 6–23)
CO2: 27 mEq/L (ref 19–32)
Calcium: 8.3 mg/dL — ABNORMAL LOW (ref 8.4–10.5)
Chloride: 107 mEq/L (ref 96–112)
Creatinine, Ser: 0.54 mg/dL (ref 0.40–1.20)
GFR: 121.66 mL/min (ref >60.00)
Glucose, Bld: 84 mg/dL (ref 70–99)
Potassium: 3.7 mEq/L (ref 3.5–5.1)
Sodium: 141 mEq/L (ref 135–145)
Total Bilirubin: 0.5 mg/dL (ref 0.2–1.2)
Total Protein: 6.3 g/dL (ref 6.0–8.3)

## 2021-08-04 LAB — LIPID PANEL
Cholesterol: 134 mg/dL (ref 0–200)
HDL: 35.6 mg/dL — ABNORMAL LOW (ref >39.00)
LDL Cholesterol: 92 mg/dL (ref 0–99)
NonHDL: 98.87
Total CHOL/HDL Ratio: 4
Triglycerides: 33 mg/dL (ref 0.0–149.0)
VLDL: 6.6 mg/dL (ref 0.0–40.0)

## 2021-08-04 NOTE — Progress Notes (Signed)
Chief Complaint  Patient presents with   Annual Exam     Well Woman Regina Riley is here for a complete physical.   Her last physical was >1 year ago.  Current diet: in general, a "healthy" diet. Current exercise: none. Weight is up a little Contraception? Yes Fatigue out of ordinary? Sometimes due to poor sleep Seatbelt? Yes Advanced directive? No  Health Maintenance Pap/HPV- Yes Tetanus- Due HIV screening- Yes Hep C screening- Yes  Past Medical History:  Diagnosis Date   Alopecia areata    Migraine    Obesity, Class II, BMI 35-39.9    Urticaria      Past Surgical History:  Procedure Laterality Date   COLPOSCOPY     WRIST SURGERY      Medications  Current Outpatient Medications on File Prior to Visit  Medication Sig Dispense Refill   norethindrone-ethinyl estradiol (LOESTRIN) 1-20 MG-MCG tablet Take 1 tablet by mouth daily.     rizatriptan (MAXALT) 10 MG tablet Take 1 tablet (10 mg total) by mouth as needed for migraine. May repeat in 2 hours if needed 10 tablet 2   venlafaxine XR (EFFEXOR-XR) 75 MG 24 hr capsule Take 1 capsule (75 mg total) by mouth daily with breakfast. 90 capsule 2   Allergies No Known Allergies  Review of Systems: Constitutional:  no unexpected weight changes Eye:  no recent significant change in vision Ear/Nose/Mouth/Throat:  Ears:  no tinnitus or vertigo and no recent change in hearing Nose/Mouth/Throat:  no complaints of nasal congestion, no sore throat Cardiovascular: no chest pain Respiratory:  no cough and no shortness of breath Gastrointestinal:  no abdominal pain, no change in bowel habits GU:  Female: negative for dysuria or pelvic pain Musculoskeletal/Extremities:  no pain of the joints Integumentary (Skin/Breast):  no abnormal skin lesions reported Neurologic:  no headaches Endocrine:  +intermittent fatigue Hematologic/Lymphatic:  No areas of easy bleeding  Exam BP 108/72   Pulse 63   Temp 98.5 F (36.9 C) (Oral)    Ht 5\' 6"  (1.676 m)   Wt 239 lb 2 oz (108.5 kg)   SpO2 99%   BMI 38.60 kg/m  General:  well developed, well nourished, in no apparent distress Skin:  no significant moles, warts, or growths Head:  no masses, lesions, or tenderness Eyes:  pupils equal and round, sclera anicteric without injection Ears:  canals without lesions, TMs shiny without retraction, no obvious effusion, no erythema Nose:  nares patent, septum midline, mucosa normal, and no drainage or sinus tenderness Throat/Pharynx:  lips and gingiva without lesion; tongue and uvula midline; non-inflamed pharynx; no exudates or postnasal drainage Neck: neck supple without adenopathy, thyromegaly, or masses Lungs:  clear to auscultation, breath sounds equal bilaterally, no respiratory distress Cardio:  regular rate and rhythm, no bruits, no LE edema Abdomen:  abdomen soft, nontender; bowel sounds normal; no masses or organomegaly Genital: Defer to GYN Musculoskeletal:  symmetrical muscle groups noted without atrophy or deformity Extremities:  no clubbing, cyanosis, or edema, no deformities, no skin discoloration Neuro:  gait normal; deep tendon reflexes normal and symmetric Psych: well oriented with normal range of affect and appropriate judgment/insight  Assessment and Plan  Well adult exam - Plan: CBC, Comprehensive metabolic panel, Lipid panel, Nicotine/cotinine metabolites   Well 31 y.o. female. Counseled on diet and exercise. Advanced directive form provided today.  Tdap today.  Other orders as above. Follow up in 6 mo or prn. The patient voiced understanding and agreement to the plan.  32  Carren Rang, DO 08/04/21 7:48 AM

## 2021-08-04 NOTE — Patient Instructions (Addendum)
Give [place] 2-3 business days to get the results of your labs back.   Keep the diet clean and stay active.  Please get me a copy of your advanced directive form at your convenience.   Sleep is important to [place] all. Getting good sleep is imperative to adequate functioning during the day. Work with our counselors who are trained to help people obtain quality sleep. Call [phone] to schedule an appointment or if you are curious about insurance coverage/cost.  Sleep Hygiene Tips: Do not watch TV or look at screens within 1 hour of going to bed. If you do, make sure there is a blue light filter (nighttime mode) involved. Try to go to bed around the same time every night. Wake up at the same time within 1 hour of regular time. Ex: If you wake up at 7 AM for work, do not sleep past 8 AM on days that you don't work. Do not drink alcohol before bedtime. Do not consume caffeine-containing beverages after noon or within 9 hours of intended bedtime. Get regular exercise/physical activity in your life, but not within 2 hours of planned bedtime. Do not take naps.  Do not eat within 2 hours of planned bedtime. Melatonin, 3-5 mg 30-60 minutes before planned bedtime may be helpful.  The bed should be for sleep or sex only. If after 20-30 minutes you are unable to fall asleep, get up and do something relaxing. Do this until you feel ready to go to sleep again.   Let us know if you need anything.

## 2021-08-04 NOTE — Addendum Note (Signed)
Addended by: Scharlene Gloss B on: 08/04/2021 08:05 AM   Modules accepted: Orders

## 2021-08-06 LAB — NICOTINE/COTININE METABOLITES
Cotinine: <1 ng/mL
Nicotine: <1 ng/mL

## 2021-08-31 LAB — HM PAP SMEAR: HM Pap smear: NORMAL

## 2021-11-28 IMAGING — CR DG SHOULDER 2+V*L*
4 series · 4 of 4 positions shown · non-contrast
Comparison: None.

CLINICAL DATA: Chest pain left arm numbness

EXAM:
LEFT SHOULDER - 2+ VIEW

[w shoulder grashey left]
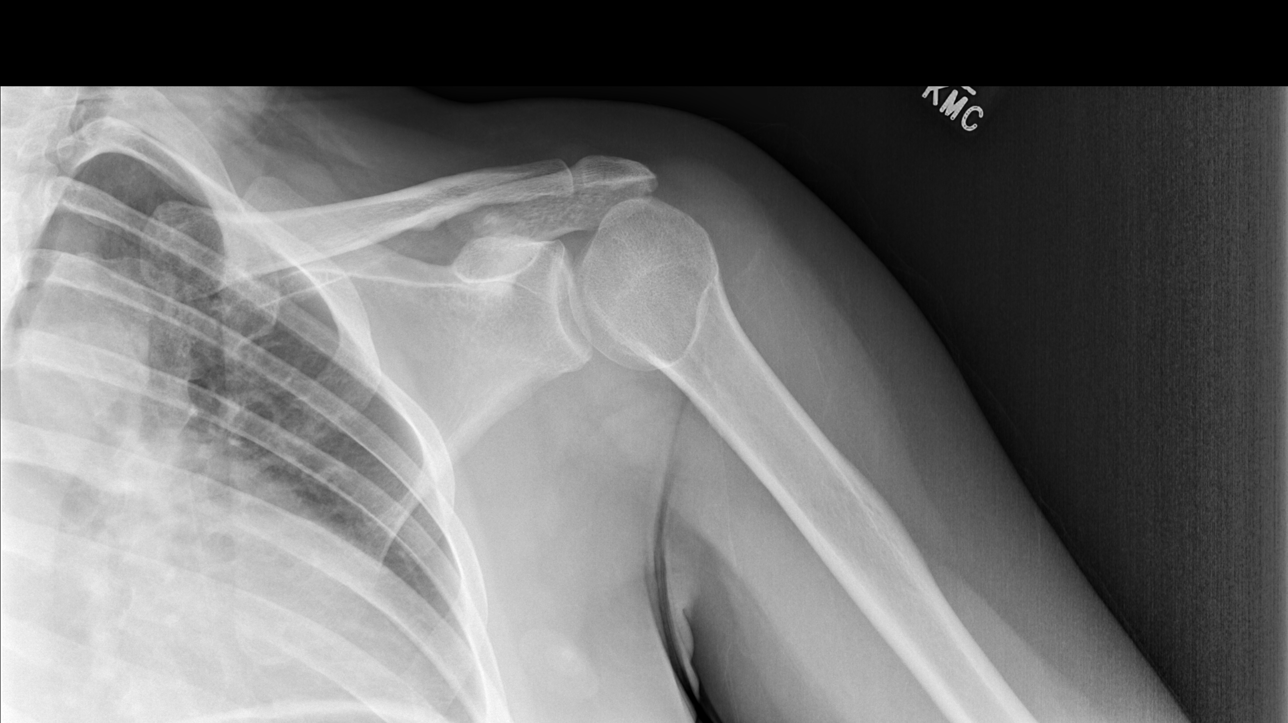

[w shoulder y view left]
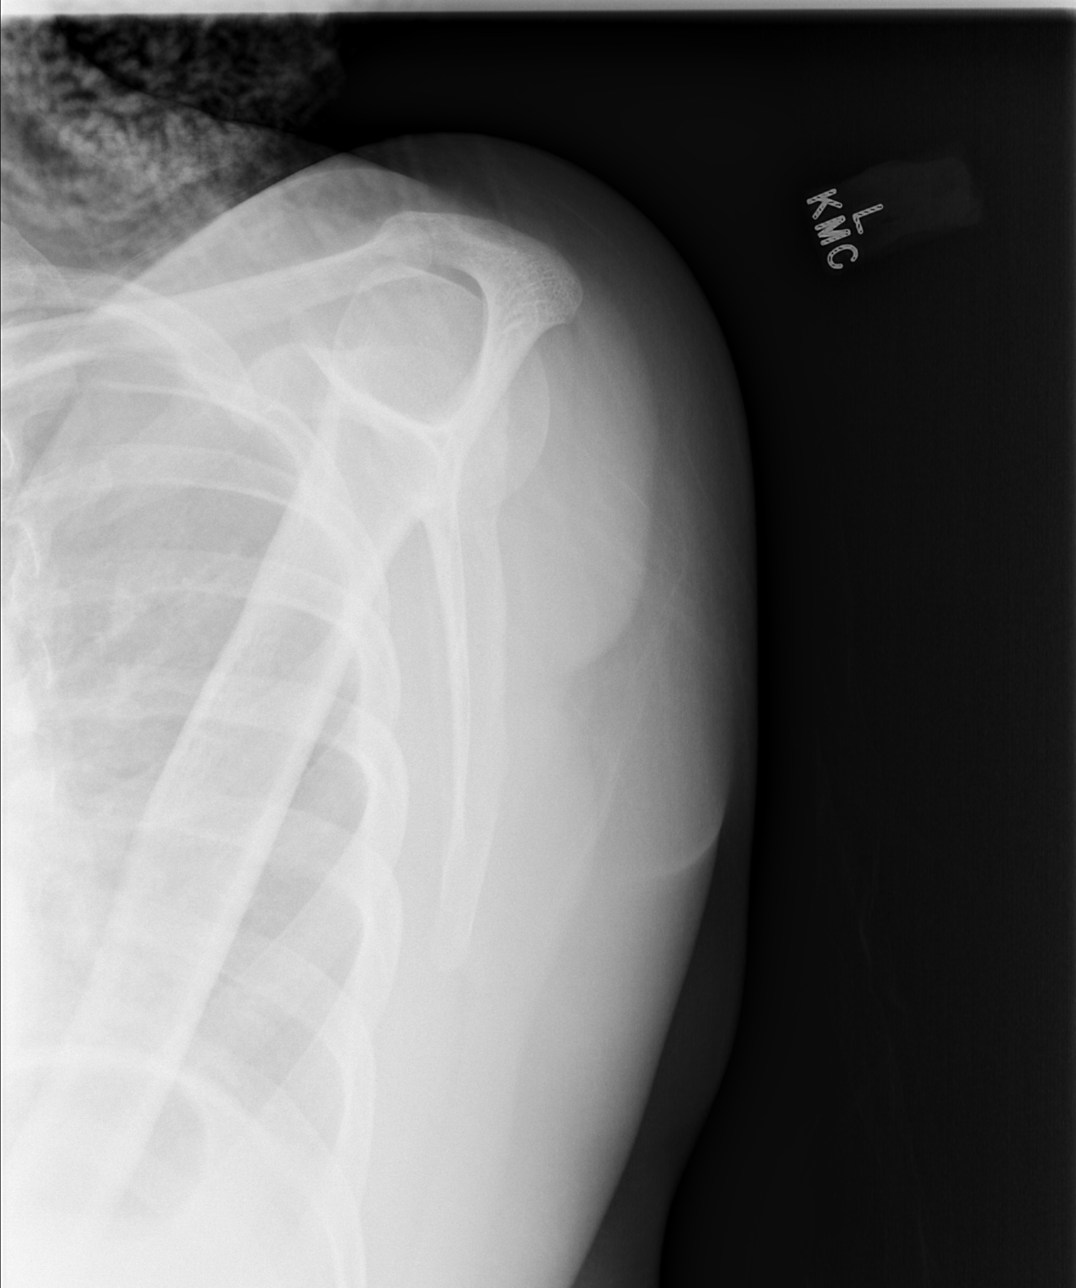

[w shoulder axillary left *]
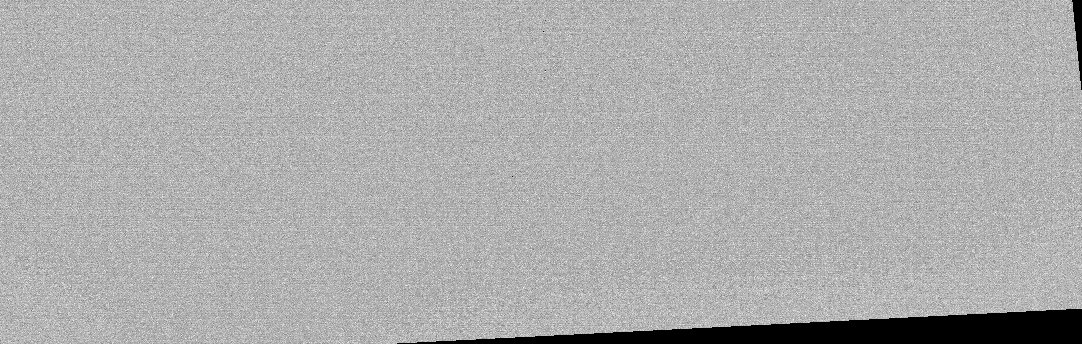

[x shoulder axillary left]
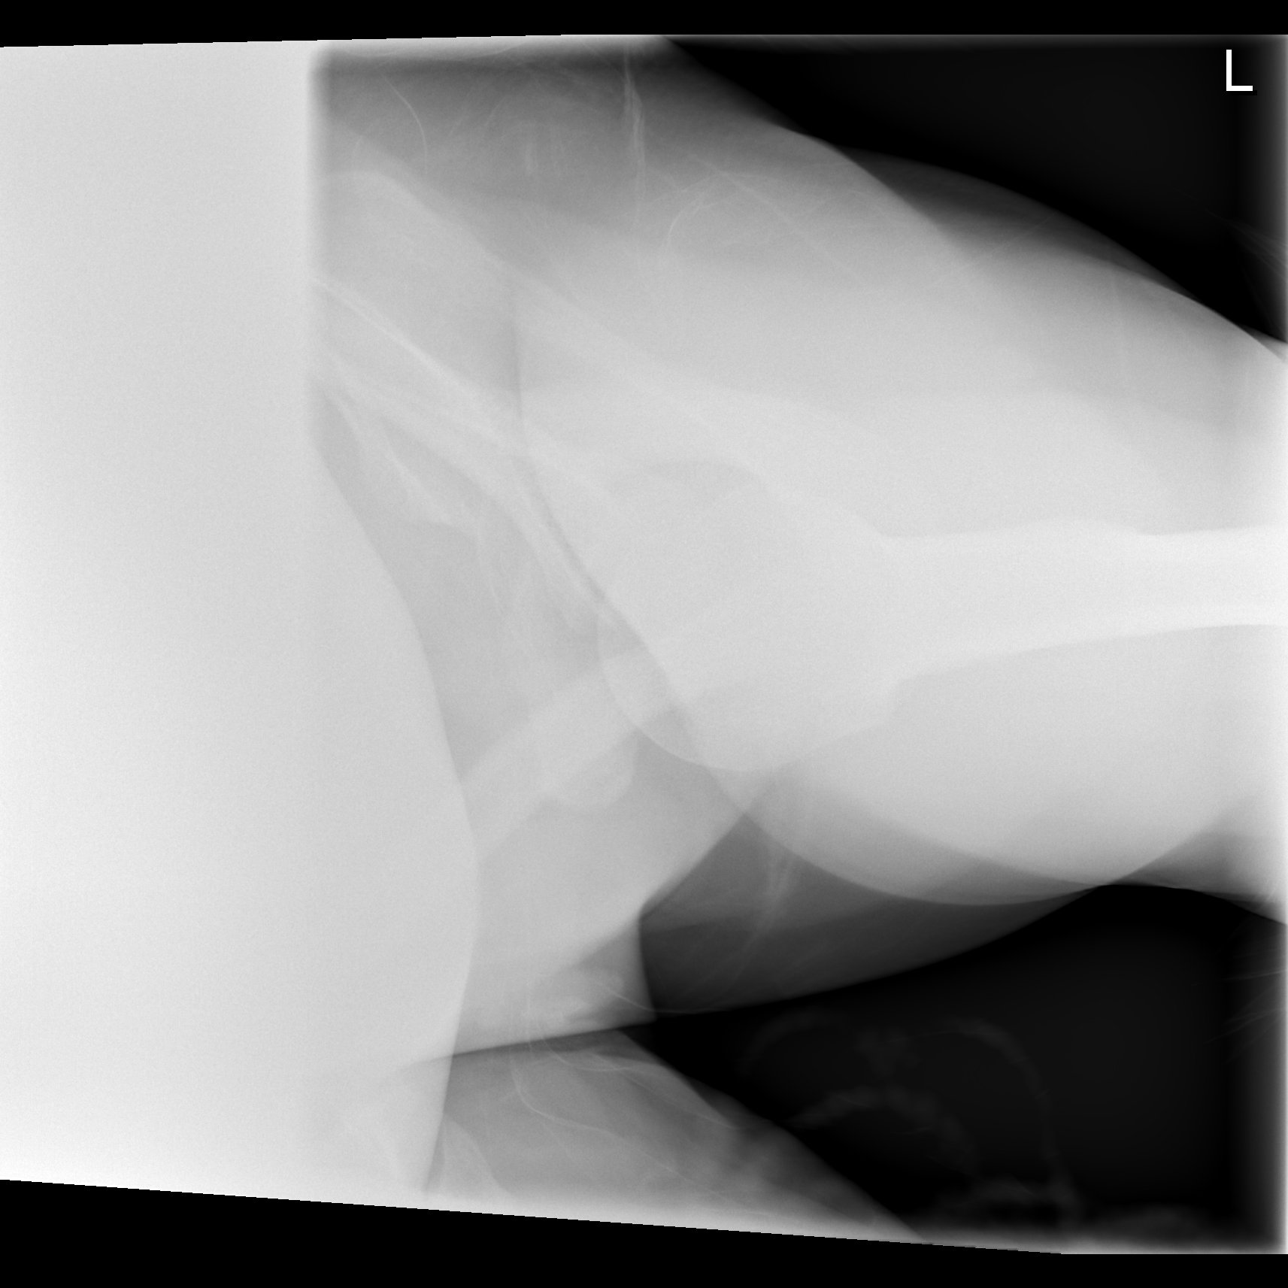

[4 of 4 positions shown; findings below may reference images not displayed]

FINDINGS: There is no evidence of fracture or dislocation. There is no
evidence of arthropathy or other focal bone abnormality. Soft
tissues are unremarkable.
IMPRESSION: Negative.

## 2021-11-28 IMAGING — CR DG CHEST 2V
2 series · 2 of 2 positions shown · non-contrast
Comparison: None.

CLINICAL DATA: 28-year-old female with chest pain.

EXAM:
CHEST - 2 VIEW

[w chest pa]
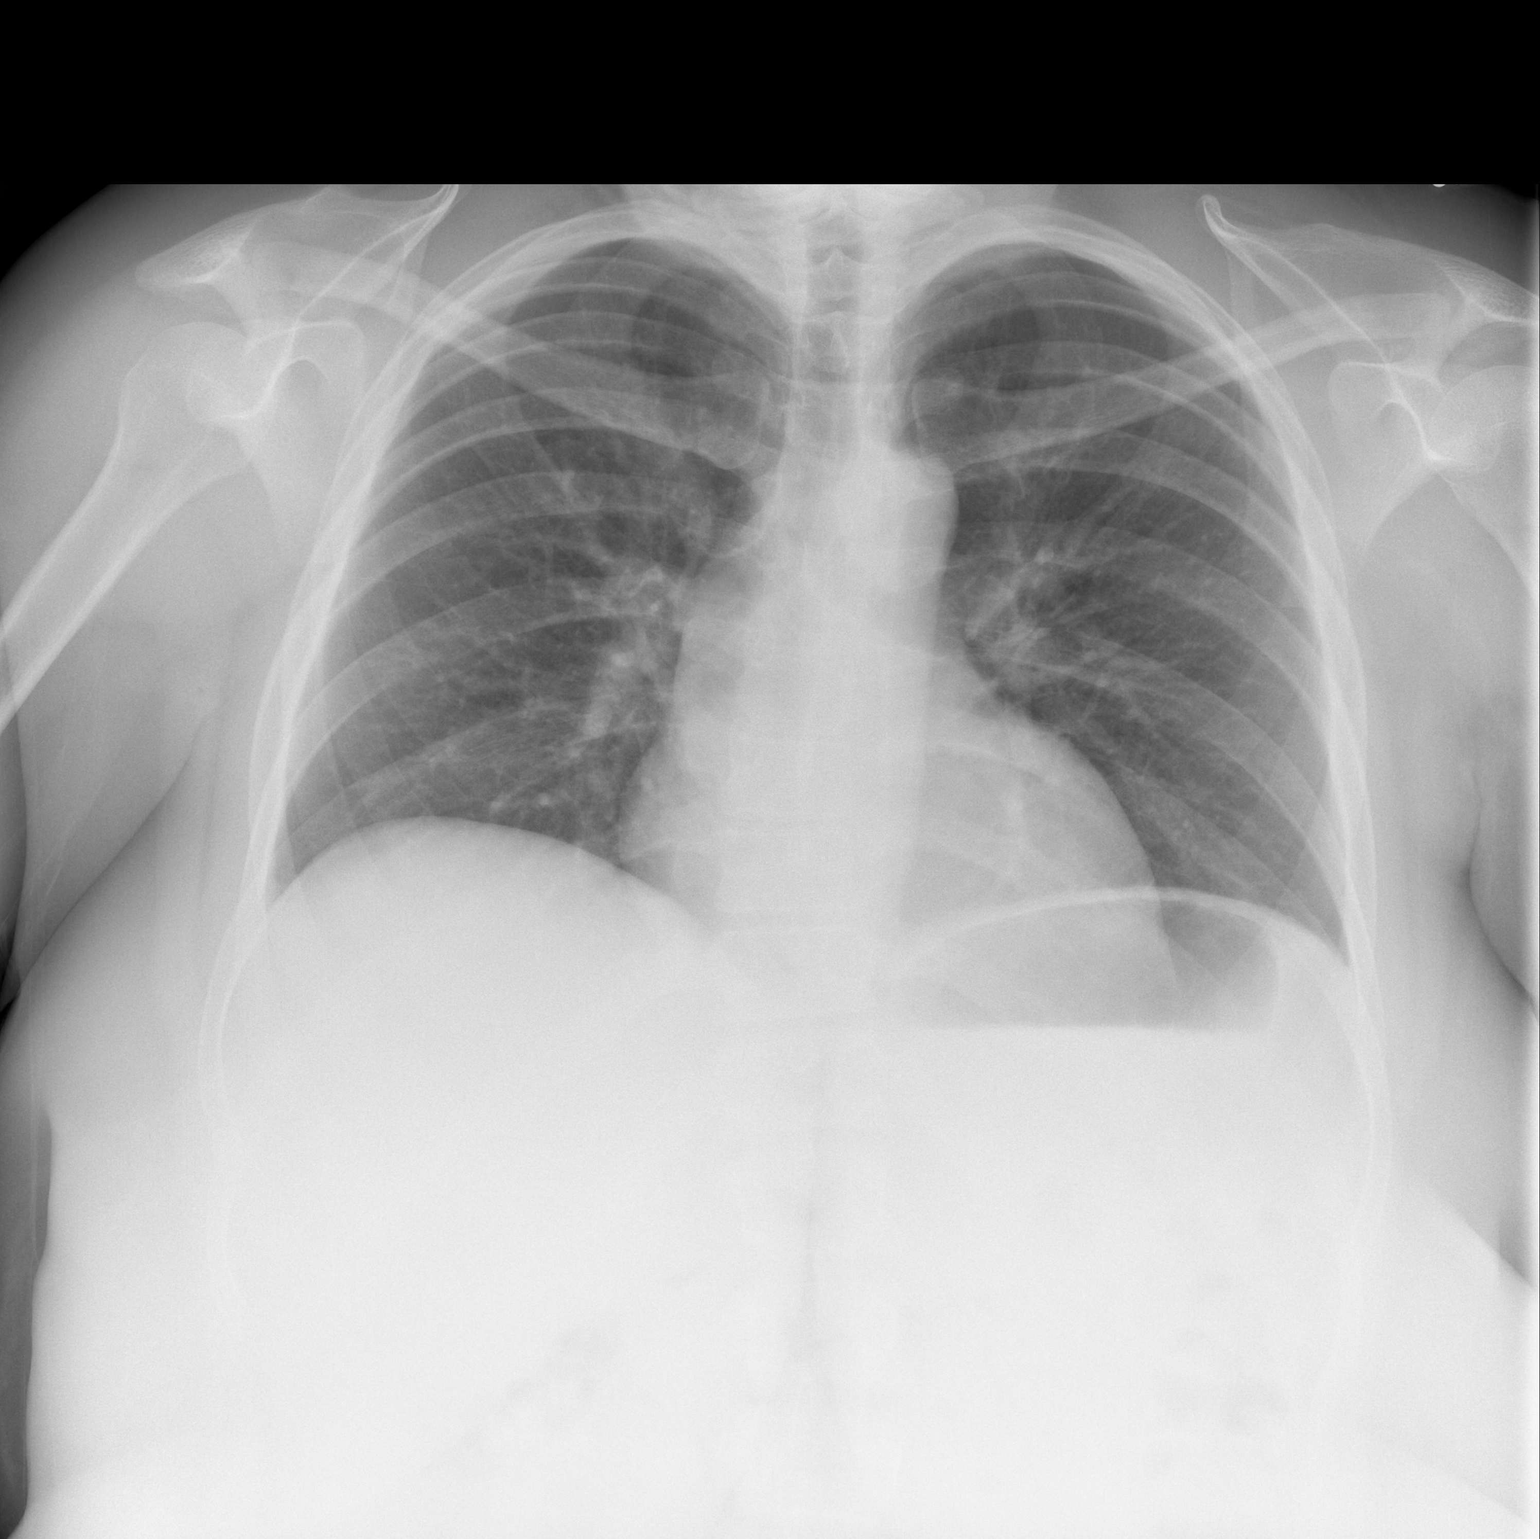

[w chest lat]
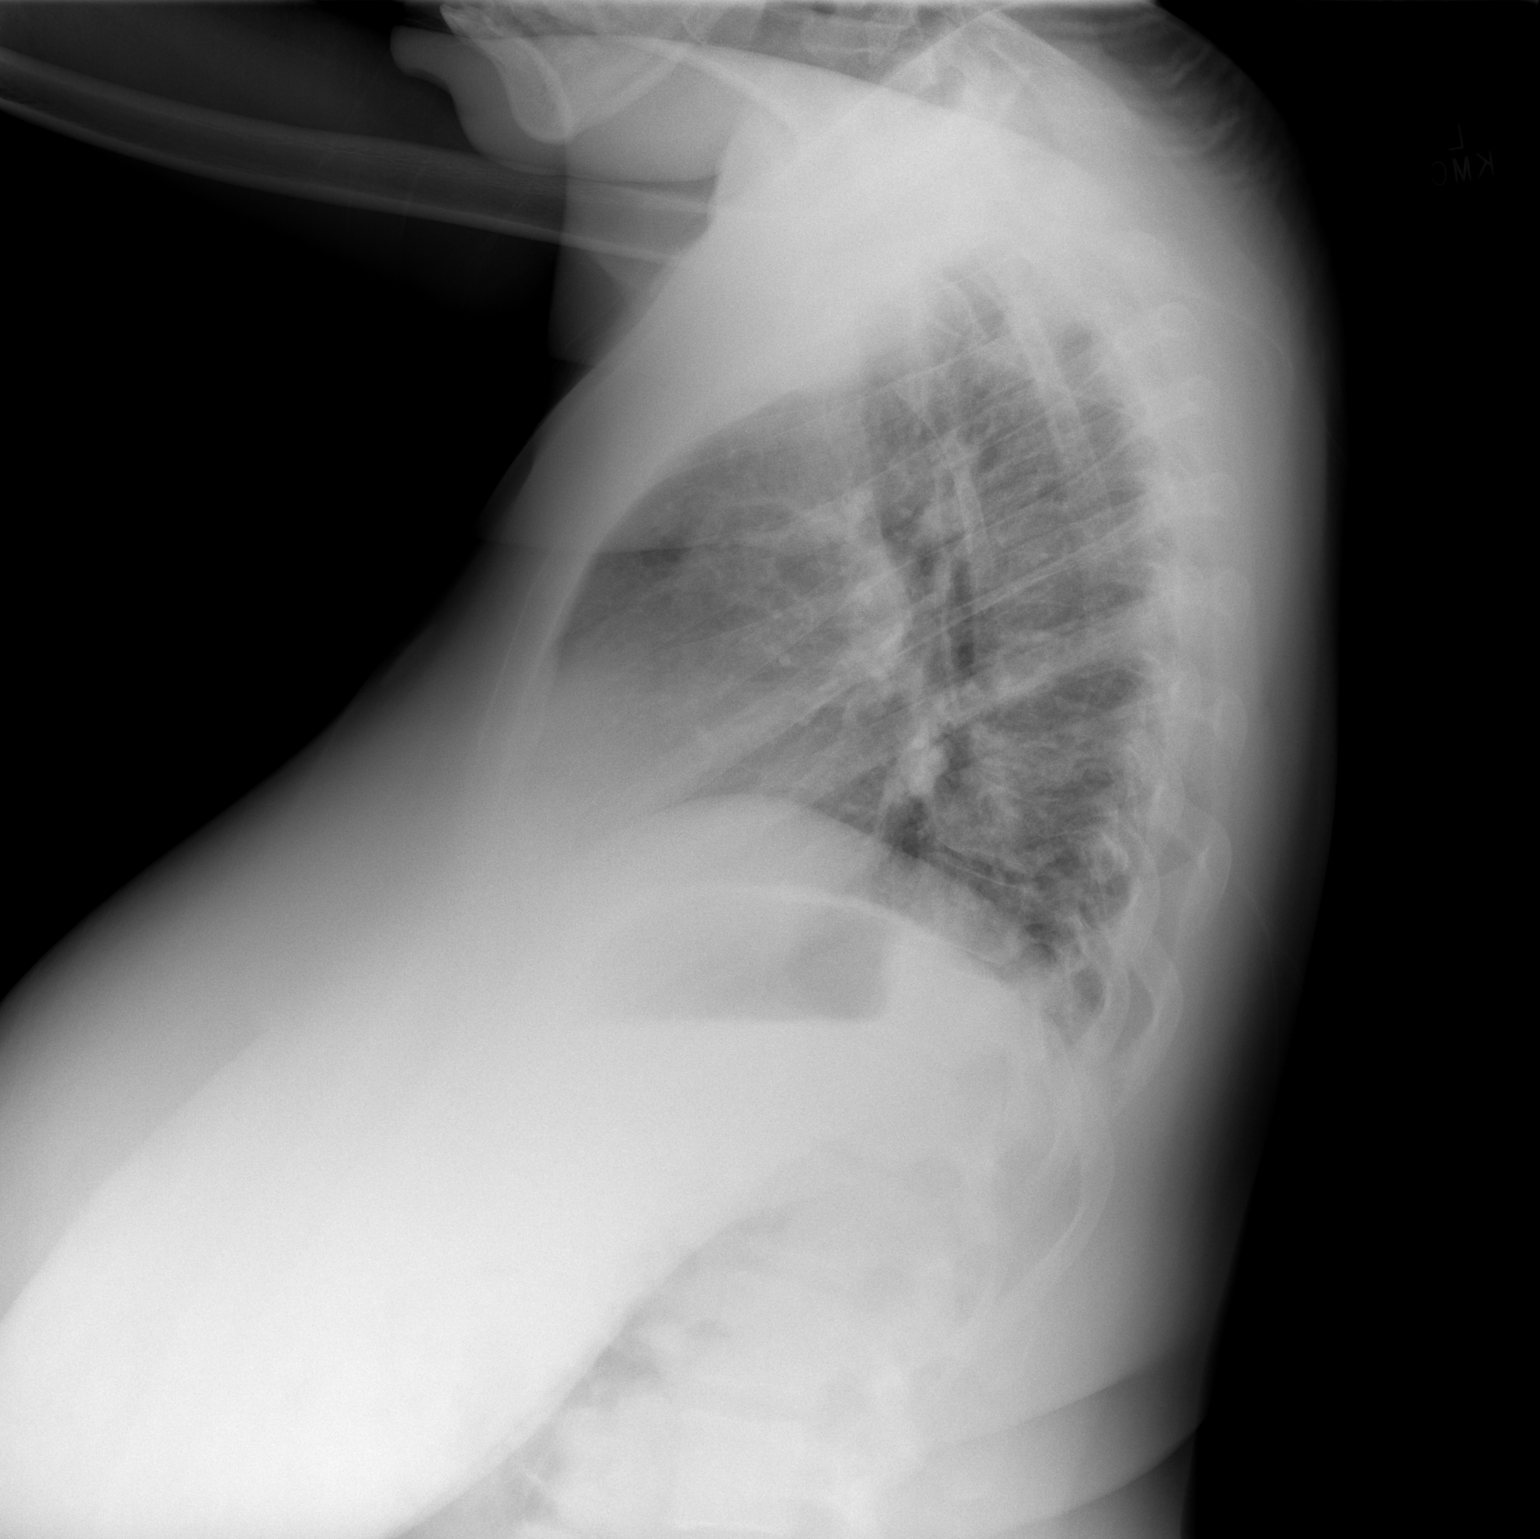

[2 of 2 positions shown; findings below may reference images not displayed]

FINDINGS: The lungs are clear. There is no pleural effusion or pneumothorax.
The cardiac silhouette is within normal limits. No acute osseous
pathology.
IMPRESSION: No active cardiopulmonary disease.

## 2022-01-08 ENCOUNTER — Other Ambulatory Visit: Payer: Self-pay | Admitting: Family Medicine

## 2022-01-08 DIAGNOSIS — G43009 Migraine without aura, not intractable, without status migrainosus: Secondary | ICD-10-CM

## 2022-02-05 ENCOUNTER — Ambulatory Visit: Payer: 59 | Admitting: Family Medicine

## 2022-02-05 ENCOUNTER — Encounter: Payer: Self-pay | Admitting: Family Medicine

## 2022-02-05 VITALS — BP 118/74 | HR 76 | Temp 98.6°F | Resp 16 | Ht 66.0 in | Wt 230.2 lb

## 2022-02-05 DIAGNOSIS — G43109 Migraine with aura, not intractable, without status migrainosus: Secondary | ICD-10-CM

## 2022-02-05 MED ORDER — NORETHINDRONE 0.35 MG PO TABS: 1.0000 | ORAL_TABLET | Freq: Every day | ORAL | 11 refills | Status: DC

## 2022-02-05 NOTE — Patient Instructions (Addendum)
Get at least 7 hrs of sleep nightly.   Minimize stress as best you can.  Try taking the Maxalt next time you get a headache and let us know how it works. If it doesn't do well, I would like to know.   Let us know if you need anything.

## 2022-02-05 NOTE — Progress Notes (Signed)
Chief Complaint  Patient presents with   Follow-up    Fasting    Subjective: Patient is a 32 y.o. female here for follow up.  Patient has a history of migraines, sometimes with an ocular aura.  She takes Effexor 75 mg daily and reports compliance and no adverse effects.  She rarely gets headaches.  She usually uses ibuprofen first.  She rarely uses the Maxalt.  She has been on Imitrex before but it was ineffective.  She does not remember how well the Maxalt works.  No neurologic signs or symptoms.  Past Medical History:  Diagnosis Date   Alopecia areata    Migraine    Obesity, Class II, BMI 35-39.9    Urticaria     Objective: BP 118/74   Pulse 76   Temp 98.6 F (37 C) (Oral)   Resp 16   Ht 5\' 6"  (1.676 m)   Wt 230 lb 4 oz (104.4 kg)   LMP 01/15/2022 (Approximate)   SpO2 97%   BMI 37.16 kg/m  General: Awake, appears stated age Neuro: DTRs equal and symmetric throughout, no clonus, no cerebellar signs, 5/5 strength throughout, gait is normal Lungs: No accessory muscle use Psych: Age appropriate judgment and insight, normal affect and mood  Assessment and Plan: Migraine with aura and without status migrainosus, not intractable  Chronic, stable.  Continue Effexor XR 75 mg daily.  I do want her to try the Maxalt.  If it is not effective, she will have failed both Maxalt and Imitrex.  We will try Nurtec or Ubrelvy at that point.  Follow-up in 6 months or as needed.  We did discuss COVID vaccination. The patient voiced understanding and agreement to the plan.  [place], DO 02/05/22  8:00 AM

## 2022-04-14 ENCOUNTER — Other Ambulatory Visit: Payer: Self-pay

## 2022-04-14 ENCOUNTER — Telehealth: Payer: 59 | Admitting: Family Medicine

## 2022-04-14 ENCOUNTER — Encounter (HOSPITAL_BASED_OUTPATIENT_CLINIC_OR_DEPARTMENT_OTHER): Payer: Self-pay | Admitting: Emergency Medicine

## 2022-04-14 ENCOUNTER — Emergency Department (HOSPITAL_BASED_OUTPATIENT_CLINIC_OR_DEPARTMENT_OTHER)
Admission: EM | Admit: 2022-04-14 | Discharge: 2022-04-14 | Disposition: A | Discharge status: Home / Self Care | Payer: 59 | Attending: Emergency Medicine | Admitting: Emergency Medicine

## 2022-04-14 ENCOUNTER — Other Ambulatory Visit (HOSPITAL_BASED_OUTPATIENT_CLINIC_OR_DEPARTMENT_OTHER): Payer: Self-pay

## 2022-04-14 ENCOUNTER — Emergency Department (HOSPITAL_BASED_OUTPATIENT_CLINIC_OR_DEPARTMENT_OTHER): Payer: 59

## 2022-04-14 DIAGNOSIS — J40 Bronchitis, not specified as acute or chronic: Secondary | ICD-10-CM | POA: Diagnosis not present

## 2022-04-14 DIAGNOSIS — R0981 Nasal congestion: Secondary | ICD-10-CM | POA: Diagnosis present

## 2022-04-14 DIAGNOSIS — Z1152 Encounter for screening for COVID-19: Secondary | ICD-10-CM | POA: Insufficient documentation

## 2022-04-14 LAB — RESP PANEL BY RT-PCR (RSV, FLU A&B, COVID)  RVPGX2
Influenza A by PCR: NEGATIVE
Influenza B by PCR: NEGATIVE
Resp Syncytial Virus by PCR: NEGATIVE
SARS Coronavirus 2 by RT PCR: NEGATIVE

## 2022-04-14 LAB — GROUP A STREP BY PCR: Group A Strep by PCR: NOT DETECTED

## 2022-04-14 MED ORDER — ACETAMINOPHEN 500 MG PO TABS
1000.0000 mg | ORAL_TABLET | Freq: Once | ORAL | Status: AC
  Administered 2022-04-14: 1000 mg via ORAL
  Filled 2022-04-14: qty 2

## 2022-04-14 MED ORDER — PREDNISONE 20 MG PO TABS
40.0000 mg | ORAL_TABLET | Freq: Every day | ORAL | 0 refills | Status: AC
  Filled 2022-04-14: qty 10, 5d supply, fill #0

## 2022-04-14 MED ORDER — AZITHROMYCIN 250 MG PO TABS
ORAL_TABLET | ORAL | 0 refills | Status: DC
  Filled 2022-04-14: qty 6, 5d supply, fill #0

## 2022-04-14 MED ORDER — OXYMETAZOLINE HCL 0.05 % NA SOLN
1.0000 | Freq: Two times a day (BID) | NASAL | Status: DC
  Administered 2022-04-14: 1 via NASAL
  Filled 2022-04-14: qty 30

## 2022-04-14 NOTE — ED Provider Notes (Signed)
[facility] Provider Note   CSN: [id number] Arrival date & time: 04/14/22  0920     History  Chief Complaint  Patient presents with   URI    Regina Riley is a 32 y.o. female.  Patient here with nasal congestion and cough for the last few days.  Had negative strep and COVID about a week ago.  Still with congestion and bodyaches and chills.  Nothing makes it worse or better.  No significant medical history.  No recent surgery or travel.  No chest pain or shortness of breath.  The history is provided by the patient.       Home Medications Prior to Admission medications   Medication Sig Start Date End Date Taking? Authorizing Provider  azithromycin (ZITHROMAX) 250 MG tablet Two tablets day one, 1 table next 4 days 04/14/22  Yes Donnie Panik, DO  norethindrone (ORTHO MICRONOR) 0.35 MG tablet Take 1 tablet (0.35 mg total) by mouth daily. 02/05/22   Shelda Pal, DO  predniSONE (DELTASONE) 20 MG tablet Take 2 tablets (40 mg total) by mouth daily for 5 days. 04/14/22 04/19/22 Yes Rawson Minix, DO  rizatriptan (MAXALT) 10 MG tablet Take 1 tablet (10 mg total) by mouth as needed for migraine. May repeat in 2 hours if needed 02/03/21   Shelda Pal, DO  venlafaxine XR (EFFEXOR-XR) 75 MG 24 hr capsule TAKE 1 CAPSULE BY MOUTH DAILY WITH BREAKFAST. 01/08/22   Shelda Pal, DO      Allergies    Patient has no known allergies.    Review of Systems   Review of Systems  Physical Exam Updated Vital Signs BP (!) 141/90   Pulse (!) 110   Temp 99.8 F (37.7 C) (Oral)   Resp 20   Wt 104.3 kg   LMP 04/07/2022 (Approximate)   SpO2 100%   BMI 37.12 kg/m  Physical Exam Vitals and nursing note reviewed.  Constitutional:      General: She is not in acute distress.    Appearance: She is well-developed. She is not ill-appearing.  HENT:     Head: Normocephalic and atraumatic.     Right Ear: Tympanic membrane  normal.     Left Ear: Tympanic membrane normal.     Nose: Congestion present.     Comments: Nasal turbinates swollen and inflamed    Mouth/Throat:     Mouth: Mucous membranes are moist.  Eyes:     Extraocular Movements: Extraocular movements intact.     Conjunctiva/sclera: Conjunctivae normal.     Pupils: Pupils are equal, round, and reactive to light.  Cardiovascular:     Rate and Rhythm: Normal rate and regular rhythm.     Pulses: Normal pulses.     Heart sounds: Normal heart sounds. No murmur heard. Pulmonary:     Effort: Pulmonary effort is normal. No respiratory distress.     Breath sounds: Normal breath sounds.  Abdominal:     Palpations: Abdomen is soft.     Tenderness: There is no abdominal tenderness.  Musculoskeletal:        General: No swelling.     Cervical back: Normal range of motion and neck supple.  Skin:    General: Skin is warm and dry.     Capillary Refill: Capillary refill takes less than 2 seconds.  Neurological:     General: No focal deficit present.     Mental Status: She is alert.  Psychiatric:  Mood and Affect: Mood normal.     ED Results / Procedures / Treatments   Labs (all labs ordered are listed, but only abnormal results are displayed) Labs Reviewed  GROUP A STREP BY PCR  RESP PANEL BY RT-PCR (RSV, FLU A&B, COVID)  [id number]    EKG None  Radiology DG Chest 2 View  Result Date: 04/14/2022 CLINICAL DATA:  Cough.  Sore throat. EXAM: CHEST - 2 VIEW COMPARISON:  Chest radiographs 01/15/2019 FINDINGS: Cardiac silhouette and mediastinal contours are within normal limits. The lungs are clear. No pleural effusion or pneumothorax. No acute skeletal abnormality. IMPRESSION: No active cardiopulmonary disease. Electronically Signed   By: Yvonne Kendall M.D.   On: 04/14/2022 09:55    Procedures Procedures    Medications Ordered in ED Medications  oxymetazoline (AFRIN) 0.05 % nasal spray 1 spray (1 spray Each Nare Given 04/14/22 1012)   acetaminophen (TYLENOL) tablet 1,000 mg (1,000 mg Oral Given 04/14/22 1011)    ED Course/ Medical Decision Making/ A&P                             Medical Decision Making Amount and/or Complexity of Data Reviewed Radiology: ordered.  Risk OTC drugs. Prescription drug management.   Regina Riley is here with URI symptoms.  Low-grade fever.  Otherwise unremarkable vitals.  Nasal congestion on exam.  Overall sounds like a bronchitis versus pneumonia for differential.  She had negative strep and COVID test already earlier this week.  Chest x-ray today per my review interpretation shows no obvious pneumonia.  Will treat with prednisone and Z-Pak and Afrin.  I have no concern for other cardiac or pulmonary process.  She understands return precautions.  Patient discharged in good condition.  This chart was dictated using voice recognition software.  Despite best efforts to proofread,  errors can occur which can change the documentation meaning.         Final Clinical Impression(s) / ED Diagnoses Final diagnoses:  Bronchitis    Rx / DC Orders ED Discharge Orders          Ordered    predniSONE (DELTASONE) 20 MG tablet  Daily        04/14/22 1022    azithromycin (ZITHROMAX) 250 MG tablet        04/14/22 1022              Elaya Droege, DO 04/14/22 1024

## 2022-04-14 NOTE — ED Notes (Signed)
Reviewed discharge instructions and recommendations with pt. Pt states understanding- 

## 2022-04-14 NOTE — ED Triage Notes (Signed)
URI x 10 days , persistent cough , lethargic , shortness of breath , was seen at UC  1 week ago  negative 4 panels and strep .

## 2022-05-03 ENCOUNTER — Other Ambulatory Visit: Payer: Self-pay | Admitting: Family Medicine

## 2022-05-03 ENCOUNTER — Ambulatory Visit: Payer: 59 | Admitting: Family Medicine

## 2022-05-03 VITALS — BP 136/86 | HR 84 | Temp 98.0°F | Resp 16 | Ht 65.0 in | Wt 239.6 lb

## 2022-05-03 DIAGNOSIS — R058 Other specified cough: Secondary | ICD-10-CM | POA: Diagnosis not present

## 2022-05-03 MED ORDER — FLUTICASONE PROPIONATE HFA 110 MCG/ACT IN AERO: 2.0000 | INHALATION_SPRAY | Freq: Two times a day (BID) | RESPIRATORY_TRACT | 1 refills | Status: DC

## 2022-05-03 NOTE — Progress Notes (Signed)
Chief Complaint  Patient presents with   Cough    Here Cough    Regina Riley here for URI complaints.  Duration: 4 weeks  Associated symptoms:  productive (clear) cough Denies: sinus congestion, sinus pain, rhinorrhea, itchy watery eyes, ear pain, ear drainage, sore throat, wheezing, shortness of breath, myalgia, and fevers Treatment to date: Tessalon Perles, prednisone, Zpak, breathing tx's, SABA, Nyquil, Dayquil, Tylenol Cold/Flu CXR done in ER was neg.  Sick contacts: No  Past Medical History:  Diagnosis Date   Alopecia areata    Migraine    Obesity, Class II, BMI 35-39.9    Urticaria     Objective BP 136/86 (BP Location: Right Arm, Patient Position: Sitting, Cuff Size: Normal)   Pulse 84   Temp 98 F (36.7 C) (Oral)   Resp 16   Ht 5\' 5"  (1.651 m)   Wt 239 lb 9.6 oz (108.7 kg)   LMP 04/07/2022 (Approximate)   SpO2 97%   BMI 39.87 kg/m  General: Awake, alert, appears stated age HEENT: AT, [place], ears patent b/l and TM's neg, nares patent w/o discharge, pharynx pink and without exudates, MMM Neck: No masses or asymmetry Heart: RRR, no LE edema Lungs: CTAB, no accessory muscle use Psych: Age appropriate judgment and insight, normal mood and affect  Post-viral cough syndrome - Plan: fluticasone (FLOVENT HFA) 110 MCG/ACT inhaler  Continue to push fluids, practice good hand hygiene, cover mouth when coughing. ICS. Rinse mouth out after use. +famhx of asthma in mom dx'd in her late 58's.  F/u in 1 mo prn. If starting to experience fevers, shaking, or shortness of breath, seek immediate care. Pt voiced understanding and agreement to the plan.  Jilda Roche [place], DO 05/03/22 4:04 PM

## 2022-05-03 NOTE — Patient Instructions (Signed)
Let me know if there are cost issues with the inhaler.  Don't forget to rinse your mouth out after use of the inhaler.  OK to continue the rescue inhaler (albuterol).   This can last up to another month or so. If it lasts beyond, please let me know.   Let us know if you need anything.

## 2022-05-10 ENCOUNTER — Other Ambulatory Visit: Payer: Self-pay | Admitting: Family Medicine

## 2022-05-10 ENCOUNTER — Telehealth: Payer: Self-pay | Admitting: Family Medicine

## 2022-05-10 MED ORDER — BECLOMETHASONE DIPROP HFA 40 MCG/ACT IN AERB: 2.0000 | INHALATION_SPRAY | Freq: Two times a day (BID) | RESPIRATORY_TRACT | 1 refills | Status: DC

## 2022-05-10 NOTE — Telephone Encounter (Signed)
Have not seen anything  on this

## 2022-05-10 NOTE — Telephone Encounter (Signed)
Called left detailed message of PCP request/response

## 2022-05-10 NOTE — Telephone Encounter (Signed)
Pt wanted to check on the status of the PA on her flovent or alternatives.

## 2022-05-10 NOTE — Telephone Encounter (Signed)
Called left detailed message inhaler sent in. Call back if having any more problems

## 2022-05-10 NOTE — Telephone Encounter (Signed)
Alternatives are Qvar, Asmanex, arnuity ellipta.

## 2022-05-17 ENCOUNTER — Other Ambulatory Visit (HOSPITAL_COMMUNITY): Payer: Self-pay

## 2022-05-17 NOTE — Telephone Encounter (Signed)
Med switched to Qvar, is PA still needed? Thanks

## 2022-05-31 ENCOUNTER — Encounter: Payer: Self-pay | Admitting: Family Medicine

## 2022-07-05 ENCOUNTER — Encounter: Payer: Self-pay | Admitting: Family Medicine

## 2022-07-05 ENCOUNTER — Ambulatory Visit: Payer: 59 | Admitting: Family Medicine

## 2022-07-05 VITALS — BP 120/80 | HR 96 | Temp 99.0°F | Ht 66.0 in | Wt 234.2 lb

## 2022-07-05 DIAGNOSIS — R053 Chronic cough: Secondary | ICD-10-CM | POA: Diagnosis not present

## 2022-07-05 DIAGNOSIS — Z77120 Contact with and (suspected) exposure to mold (toxic): Secondary | ICD-10-CM

## 2022-07-05 MED ORDER — FLUTICASONE FUROATE-VILANTEROL 100-25 MCG/ACT IN AEPB: 1.0000 | INHALATION_SPRAY | Freq: Every day | RESPIRATORY_TRACT | 0 refills | Status: DC

## 2022-07-05 NOTE — Patient Instructions (Signed)
Let me know if the inhaler isn't covered and we can go from there.  If you do not hear anything about your referral in the next 1-2 weeks, call our office and ask for an update.  Try to get this situated at work if possible.   Let us know if you need anything.

## 2022-07-05 NOTE — Progress Notes (Signed)
Chief Complaint  Patient presents with   Follow-up    Exposure to mold    Regina Riley is 32 y.o. and is here for a cough.  Duration: 6 months; possibly exposed to mold when she is at work, feels the worst there.  Productive? No Associated symptoms: wheezing, some SOB Denies: fever, night sweats, nasal congestion, rhinorrhea, facial pain, hemoptysis Hx of GERD? No ACEi? No Trialed on Qvar without sig relief.   Past Medical History:  Diagnosis Date   Alopecia areata    Migraine    Obesity, Class II, BMI 35-39.9    Urticaria    Family History  Problem Relation Age of Onset   Asthma Mother    Hypertension Father    Hyperlipidemia Father    Diabetes Father    Heart attack Maternal Grandfather 21   Heart disease Paternal Grandfather    BP 120/80 (BP Location: Left Arm, Patient Position: Sitting, Cuff Size: Large)   Pulse 96   Temp 99 F (37.2 C) (Oral)   Ht 5\' 6"  (1.676 m)   Wt 234 lb 4 oz (106.3 kg)   SpO2 99%   BMI 37.81 kg/m  Gen: Awake, alert, appears stated age HEENT: Ears neg, nares patent without D/C, turbinates unremarkable, Pharynx pink without exudate Neck: Supple, no masses or asymmetry, no tenderness Heart: RRR, no LE edema Lungs: CTAB, normal effort, no accessory muscle use Psych: Age appropriate judgement and insight, normal mood and affect  Chronic cough - Plan: Ambulatory referral to Pulmonology  Mold exposure  Chronic, not controlled.  Given exposure/exacerbation, will consider treating this like asthma.  Will stop Qvar, send in Lindsay.  Unsure what her insurance will cover but would like her on an ICS/LABA combination inhaler.  Will refer to pulmonology for their opinion as well.  She is not having a productive cough so a sputum culture is not likely beneficial but I do not think she has a mycotic pneumonia. The patient voiced understanding and agreement to the plan.  Jilda Roche [place], DO 07/05/22 9:35 AM

## 2022-08-03 ENCOUNTER — Other Ambulatory Visit: Payer: Self-pay | Admitting: Family Medicine

## 2022-08-09 ENCOUNTER — Encounter: Payer: 59 | Admitting: Family Medicine

## 2022-08-09 ENCOUNTER — Institutional Professional Consult (permissible substitution) (HOSPITAL_BASED_OUTPATIENT_CLINIC_OR_DEPARTMENT_OTHER): Payer: 59 | Admitting: Pulmonary Disease

## 2022-08-26 ENCOUNTER — Encounter: Payer: Self-pay | Admitting: Family Medicine

## 2022-08-26 ENCOUNTER — Ambulatory Visit (INDEPENDENT_AMBULATORY_CARE_PROVIDER_SITE_OTHER): Payer: 59 | Admitting: Family Medicine

## 2022-08-26 VITALS — BP 121/85 | HR 88 | Temp 99.0°F | Ht 65.0 in | Wt 237.4 lb

## 2022-08-26 DIAGNOSIS — Z0000 Encounter for general adult medical examination without abnormal findings: Secondary | ICD-10-CM | POA: Diagnosis not present

## 2022-08-26 NOTE — Patient Instructions (Signed)
Give [place] 2-3 business days to get the results of your labs back.   Keep the diet clean and stay active.  I recommend getting the flu shot in mid October. This suggestion would change if the CDC comes out with a different recommendation.   Please get me a copy of your advanced directive form at your convenience.   Let us know if you need anything.

## 2022-08-26 NOTE — Progress Notes (Signed)
Chief Complaint  Patient presents with   Annual Exam     Well Woman Regina Riley is here for a complete physical.   Her last physical was >1 year ago.  Current diet: in general, a "not the best" diet. Current exercise: none. Fatigue out of ordinary? No Seatbelt? Yes Advanced directive? No  Health Maintenance Pap/HPV- Yes Tetanus- Yes HIV screening- Yes Hep C screening- Yes  Past Medical History:  Diagnosis Date   Alopecia areata    Migraine    Obesity, Class II, BMI 35-39.9    Urticaria      Past Surgical History:  Procedure Laterality Date   COLPOSCOPY     WRIST SURGERY      Medications  Current Outpatient Medications on File Prior to Visit  Medication Sig Dispense Refill   fluticasone furoate-vilanterol (BREO ELLIPTA) 100-25 MCG/ACT AEPB TAKE 1 PUFF BY MOUTH EVERY DAY 60 each 2   norethindrone (ORTHO MICRONOR) 0.35 MG tablet Take 1 tablet (0.35 mg total) by mouth daily. 28 tablet 11   rizatriptan (MAXALT) 10 MG tablet Take 1 tablet (10 mg total) by mouth as needed for migraine. May repeat in 2 hours if needed 10 tablet 2   venlafaxine XR (EFFEXOR-XR) 75 MG 24 hr capsule TAKE 1 CAPSULE BY MOUTH DAILY WITH BREAKFAST. 90 capsule 2    Allergies No Known Allergies  Review of Systems: Constitutional:  no unexpected weight changes Eye:  no recent significant change in vision Ear/Nose/Mouth/Throat:  Ears:  no tinnitus or vertigo and no recent change in hearing Nose/Mouth/Throat:  no complaints of nasal congestion, no sore throat Cardiovascular: no chest pain Respiratory:  no cough and no shortness of breath Gastrointestinal:  no abdominal pain, no change in bowel habits GU:  Female: negative for dysuria or pelvic pain Musculoskeletal/Extremities:  no pain of the joints Integumentary (Skin/Breast):  no abnormal skin lesions reported Neurologic:  no headaches Endocrine:  denies fatigue Hematologic/Lymphatic:  No areas of easy bleeding  Exam BP 121/85 (BP  Location: Left Arm, Patient Position: Sitting, Cuff Size: Normal)   Pulse 88   Temp 99 F (37.2 C) (Oral)   Ht 5\' 5"  (1.651 m)   Wt 237 lb 6 oz (107.7 kg)   SpO2 99%   BMI 39.50 kg/m  General:  well developed, well nourished, in no apparent distress Skin:  no significant moles, warts, or growths Head:  no masses, lesions, or tenderness Eyes:  pupils equal and round, sclera anicteric without injection Ears:  canals without lesions, TMs shiny without retraction, no obvious effusion, no erythema Nose:  nares patent, mucosa normal, and no drainage  Throat/Pharynx:  lips and gingiva without lesion; tongue and uvula midline; non-inflamed pharynx; no exudates or postnasal drainage Neck: neck supple without adenopathy, thyromegaly, or masses Lungs:  clear to auscultation, breath sounds equal bilaterally, no respiratory distress Cardio:  regular rate and rhythm, no bruits, no LE edema Abdomen:  abdomen soft, nontender; bowel sounds normal; no masses or organomegaly Genital: Defer to GYN Musculoskeletal:  symmetrical muscle groups noted without atrophy or deformity Extremities:  no clubbing, cyanosis, or edema, no deformities, no skin discoloration Neuro:  gait normal; deep tendon reflexes normal and symmetric Psych: well oriented with normal range of affect and appropriate judgment/insight  Assessment and Plan  Well adult exam - Plan: Nicotine/cotinine metabolites( [facility]), CBC, Comprehensive metabolic panel, Lipid panel   Well 32 y.o. female. Counseled on diet and exercise. Advanced directive form provided today.  Other orders as  above. Follow up in 6 mo. The patient voiced understanding and agreement to the plan.  Jilda Roche [place], DO 08/26/22 1:17 PM

## 2022-08-27 ENCOUNTER — Other Ambulatory Visit: Payer: Self-pay

## 2022-08-27 NOTE — Addendum Note (Signed)
Addended by: Maximino Sarin on: 08/27/2022 03:30 PM   Modules accepted: Orders

## 2022-08-30 ENCOUNTER — Other Ambulatory Visit (INDEPENDENT_AMBULATORY_CARE_PROVIDER_SITE_OTHER): Payer: 59

## 2022-08-30 DIAGNOSIS — Z0000 Encounter for general adult medical examination without abnormal findings: Secondary | ICD-10-CM | POA: Diagnosis not present

## 2022-08-30 LAB — CBC
HCT: 41.4 % (ref 36.0–46.0)
Hemoglobin: 13.2 g/dL (ref 12.0–15.0)
MCHC: 31.8 g/dL (ref 30.0–36.0)
MCV: 85 fl (ref 78.0–100.0)
Platelets: 440 10*3/uL — ABNORMAL HIGH (ref 150.0–400.0)
RBC: 4.88 Mil/uL (ref 3.87–5.11)
RDW: 15.6 % — ABNORMAL HIGH (ref 11.5–15.5)
WBC: 8.5 10*3/uL (ref 4.0–10.5)

## 2022-08-30 NOTE — Progress Notes (Signed)
Pt here for redraw of CBC not obtained at 8/1 visit. No charge today.

## 2022-09-01 ENCOUNTER — Emergency Department (HOSPITAL_BASED_OUTPATIENT_CLINIC_OR_DEPARTMENT_OTHER)
Admission: EM | Admit: 2022-09-01 | Discharge: 2022-09-02 | Disposition: A | Discharge status: Home / Self Care | Payer: 59 | Attending: Emergency Medicine | Admitting: Emergency Medicine

## 2022-09-01 ENCOUNTER — Other Ambulatory Visit: Payer: Self-pay

## 2022-09-01 ENCOUNTER — Emergency Department (HOSPITAL_BASED_OUTPATIENT_CLINIC_OR_DEPARTMENT_OTHER): Payer: 59

## 2022-09-01 ENCOUNTER — Encounter (HOSPITAL_BASED_OUTPATIENT_CLINIC_OR_DEPARTMENT_OTHER): Payer: Self-pay | Admitting: Emergency Medicine

## 2022-09-01 DIAGNOSIS — D75839 Thrombocytosis, unspecified: Secondary | ICD-10-CM | POA: Diagnosis not present

## 2022-09-01 DIAGNOSIS — R110 Nausea: Secondary | ICD-10-CM | POA: Diagnosis not present

## 2022-09-01 DIAGNOSIS — R1031 Right lower quadrant pain: Secondary | ICD-10-CM | POA: Insufficient documentation

## 2022-09-01 DIAGNOSIS — R8289 Other abnormal findings on cytological and histological examination of urine: Secondary | ICD-10-CM | POA: Diagnosis not present

## 2022-09-01 DIAGNOSIS — R109 Unspecified abdominal pain: Secondary | ICD-10-CM

## 2022-09-01 LAB — COMPREHENSIVE METABOLIC PANEL
ALT: 27 U/L (ref 0–44)
AST: 27 U/L (ref 15–41)
Albumin: 3.8 g/dL (ref 3.5–5.0)
Alkaline Phosphatase: 104 U/L (ref 38–126)
Anion gap: 9 (ref 5–15)
BUN: 11 mg/dL (ref 6–20)
CO2: 26 mmol/L (ref 22–32)
Calcium: 9 mg/dL (ref 8.9–10.3)
Chloride: 104 mmol/L (ref 98–111)
Creatinine, Ser: 0.67 mg/dL (ref 0.44–1.00)
GFR, Estimated: >60 mL/min (ref >60)
Glucose, Bld: 73 mg/dL (ref 70–99)
Potassium: 3.4 mmol/L — ABNORMAL LOW (ref 3.5–5.1)
Sodium: 139 mmol/L (ref 135–145)
Total Bilirubin: 0.6 mg/dL (ref 0.3–1.2)
Total Protein: 7.1 g/dL (ref 6.5–8.1)

## 2022-09-01 LAB — URINALYSIS, ROUTINE W REFLEX MICROSCOPIC
Bilirubin Urine: NEGATIVE
Glucose, UA: NEGATIVE mg/dL
Hgb urine dipstick: NEGATIVE
Ketones, ur: NEGATIVE mg/dL
Leukocytes,Ua: NEGATIVE
Nitrite: NEGATIVE
Protein, ur: 30 mg/dL — AB
Specific Gravity, Urine: 1.025 (ref 1.005–1.030)
pH: 7 (ref 5.0–8.0)

## 2022-09-01 LAB — I-STAT CHEM 8, ED
BUN: 10 mg/dL (ref 6–20)
Calcium, Ion: 1.09 mmol/L — ABNORMAL LOW (ref 1.15–1.40)
Chloride: 103 mmol/L (ref 98–111)
Creatinine, Ser: 0.6 mg/dL (ref 0.44–1.00)
Glucose, Bld: 87 mg/dL (ref 70–99)
HCT: 42 % (ref 36.0–46.0)
Hemoglobin: 14.3 g/dL (ref 12.0–15.0)
Potassium: 3.9 mmol/L (ref 3.5–5.1)
Sodium: 140 mmol/L (ref 135–145)
TCO2: 25 mmol/L (ref 22–32)

## 2022-09-01 LAB — URINALYSIS, MICROSCOPIC (REFLEX)

## 2022-09-01 LAB — CBC
HCT: 42.8 % (ref 36.0–46.0)
Hemoglobin: 13.6 g/dL (ref 12.0–15.0)
MCH: 27 pg (ref 26.0–34.0)
MCHC: 31.8 g/dL (ref 30.0–36.0)
MCV: 84.9 fL (ref 80.0–100.0)
Platelets: 413 10*3/uL — ABNORMAL HIGH (ref 150–400)
RBC: 5.04 MIL/uL (ref 3.87–5.11)
RDW: 14.9 % (ref 11.5–15.5)
WBC: 9.7 10*3/uL (ref 4.0–10.5)
nRBC: 0 % (ref 0.0–0.2)

## 2022-09-01 LAB — PREGNANCY, URINE: Preg Test, Ur: NEGATIVE

## 2022-09-01 LAB — LIPASE, BLOOD: Lipase: 13 U/L (ref 11–51)

## 2022-09-01 MED ORDER — IOHEXOL 300 MG/ML  SOLN
125.0000 mL | Freq: Once | INTRAMUSCULAR | Status: AC | PRN
  Administered 2022-09-01: 125 mL via INTRAVENOUS

## 2022-09-01 NOTE — ED Triage Notes (Signed)
RLQ abdominal pain this evening.  Sudden onset, no radiation.  No fever.  Some nausea and diaphoresis.  No vaginal discharge.  Pain decreases with leaning to left side.

## 2022-09-01 NOTE — ED Provider Notes (Cosign Needed Addendum)
[facility] Provider Note   CSN: [id number] Arrival date & time: 09/01/22  1736     History Chief Complaint  Patient presents with   Abdominal Pain    Regina Riley is a 32 y.o. female reportedly otherwise healthy presents to the ER for evaluation of RLQ pain that has gradually improved. The patient reported that around 1630, she was sitting at her desk when she started to feel some stabbing/cramping RLQ pain. She sat there, but thought she may feel better with walking around. She reports that when she stood up, she thought the pain worsened and was hot and felt clammy. Had some momentary nausea at the moment, but none now. No vomiting or diarrhea. She reports she does suffer from frequent constipation. She reports that she took some Tylenol on the way over to the hospital and feel much better. She reports the pain is very mild and dull now. She denies any dysuria, hematuria, urgency, frequency, vaginal discharge, vaginal bleeding.  She denies any allergies to any medications.   Abdominal Pain Associated symptoms: constipation and nausea   Associated symptoms: no chest pain, no chills, no diarrhea, no dysuria, no fever, no hematuria, no shortness of breath, no vaginal bleeding, no vaginal discharge and no vomiting        Home Medications Prior to Admission medications   Medication Sig Start Date End Date Taking? Authorizing Provider  fluticasone furoate-vilanterol (BREO ELLIPTA) 100-25 MCG/ACT AEPB TAKE 1 PUFF BY MOUTH EVERY DAY 08/03/22   Wendling, Jilda Roche, DO  norethindrone (ORTHO MICRONOR) 0.35 MG tablet Take 1 tablet (0.35 mg total) by mouth daily. 02/05/22   Sharlene Dory, DO  rizatriptan (MAXALT) 10 MG tablet Take 1 tablet (10 mg total) by mouth as needed for migraine. May repeat in 2 hours if needed 02/03/21   Sharlene Dory, DO  venlafaxine XR (EFFEXOR-XR) 75 MG 24 hr capsule TAKE 1 CAPSULE BY MOUTH DAILY WITH  BREAKFAST. 01/08/22   Sharlene Dory, DO      Allergies    Patient has no known allergies.    Review of Systems   Review of Systems  Constitutional:  Negative for chills and fever.  Respiratory:  Negative for shortness of breath.   Cardiovascular:  Negative for chest pain.  Gastrointestinal:  Positive for abdominal pain, constipation and nausea. Negative for blood in stool, diarrhea, rectal pain and vomiting.  Genitourinary:  Negative for dysuria, frequency, hematuria, urgency, vaginal bleeding, vaginal discharge and vaginal pain.  Musculoskeletal:  Negative for back pain.    Physical Exam Updated Vital Signs BP 130/81 (BP Location: Left Arm)   Pulse 80   Temp 98.6 F (37 C) (Oral)   Resp 17   Ht 5\' 5"  (1.651 m)   Wt 106.6 kg   LMP 08/23/2022   SpO2 99%   BMI 39.11 kg/m  Physical Exam Vitals and nursing note reviewed.  Constitutional:      General: She is not in acute distress.    Appearance: She is not ill-appearing or toxic-appearing.     Comments: Lying on stretcher, resting, does not appear in any acute distress.  Eyes:     General: No scleral icterus. Cardiovascular:     Rate and Rhythm: Normal rate.  Pulmonary:     Effort: Pulmonary effort is normal. No respiratory distress.     Breath sounds: Normal breath sounds.  Abdominal:     General: Bowel sounds are normal. There is no distension.  Palpations: Abdomen is soft.     Tenderness: There is abdominal tenderness in the right lower quadrant. There is no right CVA tenderness, left CVA tenderness, guarding or rebound.       Comments: Some very mild right lower quadrant tenderness palpation.  Soft.  No guarding or rebound.  No overlying skin changes noted.  Normal active bowel sounds.  Skin:    General: Skin is warm and dry.  Neurological:     Mental Status: She is alert.     ED Results / Procedures / Treatments   Labs (all labs ordered are listed, but only abnormal results are displayed) Labs  Reviewed  COMPREHENSIVE METABOLIC PANEL - Abnormal; Notable for the following components:      Result Value   Potassium 3.4 (*)    All other components within normal limits  CBC - Abnormal; Notable for the following components:   Platelets 413 (*)    All other components within normal limits  URINALYSIS, ROUTINE W REFLEX MICROSCOPIC - Abnormal; Notable for the following components:   APPearance HAZY (*)    Protein, ur 30 (*)    All other components within normal limits  URINALYSIS, MICROSCOPIC (REFLEX) - Abnormal; Notable for the following components:   Bacteria, UA FEW (*)    All other components within normal limits  I-STAT CHEM 8, ED - Abnormal; Notable for the following components:   Calcium, Ion 1.09 (*)    All other components within normal limits  LIPASE, BLOOD  PREGNANCY, URINE    EKG None  Radiology CT ABDOMEN PELVIS W CONTRAST  Result Date: 09/01/2022 CLINICAL DATA:  Right lower quadrant abdominal pain. EXAM: CT ABDOMEN AND PELVIS WITH CONTRAST TECHNIQUE: Multidetector CT imaging of the abdomen and pelvis was performed using the standard protocol following bolus administration of intravenous contrast. RADIATION DOSE REDUCTION: This exam was performed according to the departmental dose-optimization program which includes automated exposure control, adjustment of the mA and/or kV according to patient size and/or use of iterative reconstruction technique. CONTRAST:  OMNIPAQUE IOHEXOL 300 MG/ML  SOLN COMPARISON:  Right upper quadrant ultrasound 09/17/2020 FINDINGS: Lower chest: Clear lung bases. Hepatobiliary: No focal liver abnormality is seen. No gallstones, gallbladder wall thickening, or biliary dilatation. There is a gallbladder Phrygian cap. Pancreas: No ductal dilatation or inflammation. Spleen: Normal in size without focal abnormality. Adrenals/Urinary Tract: Normal adrenal glands. No hydronephrosis, renal calculi or focal renal abnormality. There is excretion of IV  contrast in both renal collecting systems. No perinephric inflammation. Unremarkable urinary bladder. Stomach/Bowel: Normal appendix, for example series 4, image 56. The stomach is unremarkable. There is no bowel obstruction or inflammation. No terminal ileal inflammation. Small to moderate volume of colonic stool. Vascular/Lymphatic: No acute vascular findings. Normal caliber abdominal aorta. The portal vein is patent. No abdominopelvic adenopathy. Reproductive: Uterus and bilateral adnexa are unremarkable. Trace free fluid in the pelvis. Other: Trace free fluid in the pelvis. No abdominal ascites. No free air or focal fluid collection. There is a small fat containing umbilical hernia. Musculoskeletal: There are no acute or suspicious osseous abnormalities. IMPRESSION: 1. No acute abnormality or explanation for abdominal pain. Normal appendix. 2. Small fat containing umbilical hernia. Electronically Signed   By: Narda Rutherford M.D.   On: 09/01/2022 23:32    Procedures Procedures    Medications Ordered in ED Medications  iohexol (OMNIPAQUE) 300 MG/ML solution 125 mL (125 mLs Intravenous Contrast Given 09/01/22 2227)  simethicone (MYLICON) 40 mg/0.47ml suspension 40 mg (40 mg Oral Given  09/02/22 4098)    ED Course/ Medical Decision Making/ A&P    Medical Decision Making Amount and/or Complexity of Data Reviewed Labs: ordered. Radiology: ordered.  Risk Prescription drug management.   32 y.o. female presents to the ER for evaluation of RLQ pain. Differential diagnosis includes but is not limited to AAA, mesenteric ischemia, appendicitis, diverticulitis, DKA, gastroenteritis, nephrolithiasis, pancreatitis, constipation, UTI, bowel obstruction, biliary disease, IBD, PUD, hepatitis, ectopic pregnancy, ovarian torsion, PID. Vital signs unremarkable. Physical exam as noted above.   Patient declines pain medication. She does not appear to be in any acute distress.  I independently reviewed and  interpreted the patient's labs.  Urinalysis shows hazy urine with 30 protein present.  There is few many bacteria but this is not squamous epithelials present and there are no red blood cells, white blood cells, leukocytes present.  Pregnancy test is negative.  BMP shows mild trace potassium 3-4 otherwise no other electrolyte or LFT abnormality.  CBC shows mildly elevated platelets at 413 otherwise no leukocytosis or anemia.  Lipase within normal limits.  CT imaging shows 1. No acute abnormality or explanation for abdominal pain. Normal appendix. 2. Small fat containing umbilical hernia. Uterus and bilateral adnexa are unremarkable per the radiology read. On my personal evaluation, the patient has a large stool burden present on the right side as well as gas throughout the bowel.   I was alerted that the CT contrast extravasated.  On palpation of the patient's arm, the soft, nontender.  No overlying erythema or warmth.  No overlying skin changes noted.  Compartments are soft.  She was instructed by nursing staff about elevation and ice.  She is not having any vaginal or urinary symptoms. Uterus and adnexa are unremarkable per radiology read. She is not having any pelvis pain. I do not think a pelvic exam or [place] is needed at this time.  I have a lower decision for any PID or ovarian torsion. The patient still is not having any pain.  She is lying on the stretcher resting comfortably.  I likely think her pain is from her large stool burden present on the right side of her abdomen.  I instructed her to use MiraLAX and also gave her some simethicone as well.  Recommend taking simethicone at home.  We discussed staying well-hydrated drinking plenty of fluid.  Encouraged her to return to the ER if she has any new or worsening symptoms.  We discussed the results of the labs/imaging. The plan is stay well hydrated, increase fluid intake, take miralax, follow up as needed. We discussed strict return precautions and  red flag symptoms. The patient verbalized their understanding and agrees to the plan. The patient is stable and being discharged home in good condition.  Portions of this report may have been transcribed using voice recognition software. Every effort was made to ensure accuracy; however, inadvertent computerized transcription errors may be present.   Final Clinical Impression(s) / ED Diagnoses Final diagnoses:  Right sided abdominal pain    Rx / DC Orders ED Discharge Orders     None         Achille Rich, PA-C 09/03/22 1232    Achille Rich, PA-C 09/03/22 1237    Franne Forts, DO 09/08/22 [phone]

## 2022-09-01 NOTE — ED Notes (Signed)
Attempted ultrasound IV x , cannulated vein not able to thread vein. Patient tolerated well

## 2022-09-01 NOTE — Progress Notes (Signed)
Patient came to CT for CT Abdomen/Pelvis with IV contrast. Patient had 20 G IV that was placed by Ultrasound guidance in her proximal right arm. IV was flushed x 3 with 10cc saline. Patient complained of tenderness in area, but no extravasation/bubbling was felt at sight. Contrast was injected while this technologist was at patient's side . This technologist did not feel any extravasation/hardening of tissue until approximately 100 cc were in. Patient said that it was less painful than when the IV was flushed with saline. A single control scan was done and no contrast was seen intravenously. The 20G was removed and EDP R. Alison Murray, [place] was asked to examine arm. No swelling, bruising or blistering was noted. A 24 G Diffusics IV was placed by this technologist and CT was completed with contrast. Patient was returned to ED with heat pack on right proximal arm and elevated. Written extravasation follow instructions were printed and given to patient and post extravasation orders entered into EPIC

## 2022-09-02 MED ORDER — SIMETHICONE 40 MG/0.6ML PO SUSP (UNIT DOSE)
40.0000 mg | Freq: Once | ORAL | Status: AC
  Administered 2022-09-02: 40 mg via ORAL
  Filled 2022-09-02: qty 0.6

## 2022-09-02 NOTE — Discharge Instructions (Addendum)
You were seen here in the emerged from today for evaluation of your right-sided abdominal pain.  Your imaging and labs are overall unremarkable.  I would think you are experiencing some gas pain or some constipation pain given the amount of stool seen in your intestines.  For this, I do recommend Gas-X.  You were given a dose today.  Please make sure you are staying well-hydrated drinking plenty of fluids, and the water.  I do recommend taking a scoop of MiraLAX in the mornings to get mixes with Gatorade, water, or coffee.  If you start having worsening abdominal pain, recurrence abdominal pain, nausea, vomiting, diarrhea, blood in your stool, fevers, or any vaginal or urinary symptoms, please return to your nearest emerged part for evaluation.  If you have any concerns, new or worsening symptoms, please return to your nearest emergency room for evaluation.  Contact a doctor if: Your belly pain changes or gets worse. You have very bad cramping or bloating in your belly. You vomit. Your pain gets worse with meals, after eating, or with certain foods. You have trouble pooping or have watery poop for more than 2-3 days. You are not hungry, or you lose weight without trying. You have signs of not getting enough fluid or water (dehydration). These may include: Dark pee, very little pee, or no pee. Cracked lips or dry mouth. Feeling sleepy or weak. You have pain when you pee or poop. Your belly pain wakes you up at night. You have blood in your pee. You have a fever. Get help right away if: You cannot stop vomiting. Your pain is only in one part of your belly, like on the right side. You have bloody or black poop, or poop that looks like tar. You have trouble breathing. You have chest pain. These symptoms may be an emergency. Get help right away. Call 911. Do not wait to see if the symptoms will go away. Do not drive yourself to the hospital.

## 2022-09-02 NOTE — ED Notes (Signed)
Patients inner right arm was soft and slightly tender to the touch. Patient instructed to continue to use ice on site.

## 2022-09-22 ENCOUNTER — Encounter (HOSPITAL_BASED_OUTPATIENT_CLINIC_OR_DEPARTMENT_OTHER): Payer: Self-pay | Admitting: Pulmonary Disease

## 2022-09-22 ENCOUNTER — Ambulatory Visit (HOSPITAL_BASED_OUTPATIENT_CLINIC_OR_DEPARTMENT_OTHER): Payer: 59 | Admitting: Pulmonary Disease

## 2022-09-22 VITALS — BP 118/88 | HR 76 | Resp 16 | Ht 65.0 in | Wt 237.8 lb

## 2022-09-22 DIAGNOSIS — R053 Chronic cough: Secondary | ICD-10-CM

## 2022-09-22 DIAGNOSIS — J45991 Cough variant asthma: Secondary | ICD-10-CM

## 2022-09-22 MED ORDER — FLUTICASONE FUROATE-VILANTEROL 100-25 MCG/ACT IN AEPB: 1.0000 | INHALATION_SPRAY | Freq: Every day | RESPIRATORY_TRACT | 2 refills | Status: DC

## 2022-09-22 NOTE — Patient Instructions (Signed)
Chronic cough --ARRANGE for pulmonary function tests --CONTINUE Breo 100 ONE puff ONCE a day --CONTINUE Albuterol TWO puffs AS NEEDED for shortness of breath or wheezing

## 2022-09-22 NOTE — Progress Notes (Signed)
Subjective:   PATIENT ID: Regina Riley GENDER: female DOB: 03/09/1990, MRN: [id number]  Chief Complaint  Patient presents with   Consult    Chronic cough x 8 months, it is mostly dry it started as productive. Thinks its contributed to mold or something in her office.     Reason for Visit: New consult for chronic cough  Regina Riley is a 32 year old female never smoker with migraine who presents for evaluation for chronic cough.  She was seen by her PCP, Dr. Carmelia Roller on 07/05/22 for nonproductive cough associated with wheezing and shortness of breath. Reported possible mold exposure at work with worsening symptoms there. Denied reflux. Not on ACE inhibitor. Reported trial of Qvar without improvement. Started on [place]. Referred to Pulmonary for evaluation.  She reports the cough began around 10 months ago (October 2023) when she moved into a new building for work. She would have cough, headache and wheezing to the point when she felt like she had a constant viral illness. Cough is intermittently productive. Some question about ventilation in the building. She continues to work there and noticed when she was gone for week long vacations where her symptoms seemed to improve. Has taken Breo 100 for one month. Cough has improved by 75%. She reports work vents have been recently cleaned. Denies allergies or reflux issues. Did have have covid last month but this has resolved.  Denies childhood asthma however had pneumonia/bronchitis annually. No current pets. 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  Past Medical History:  Diagnosis Date   Alopecia areata    Migraine    Obesity, Class II, BMI 35-39.9    Urticaria      Family History  Problem Relation  Age of Onset   Asthma Mother    Hypertension Father    Hyperlipidemia Father    Diabetes Father    Heart attack Maternal Grandfather 84   Heart disease Paternal Grandfather      Social History   Occupational History   Not on file  Tobacco Use   Smoking status: Never    Passive exposure: Never   Smokeless tobacco: Never  Vaping Use   Vaping status: Never Used  Substance and Sexual Activity   Alcohol use: Not Currently   Drug use: Not Currently   Sexual activity: Yes    Birth control/protection: Pill    No Known Allergies   Outpatient Medications Prior to Visit  Medication Sig Dispense Refill   albuterol (VENTOLIN HFA) 108 (90 Base) MCG/ACT inhaler Inhale 1-2 puffs into the lungs every 6 (six) hours as needed for wheezing or shortness of breath.     norethindrone (ORTHO MICRONOR) 0.35 MG tablet Take 1 tablet (0.35 mg total) by mouth daily. 28 tablet 11   rizatriptan (MAXALT) 10 MG tablet Take 1 tablet (10 mg total) by mouth as needed for migraine. May repeat in 2 hours if needed 10 tablet 2   venlafaxine XR (EFFEXOR-XR) 75 MG 24 hr capsule TAKE 1 CAPSULE BY MOUTH DAILY WITH BREAKFAST. 90 capsule 2   fluticasone furoate-vilanterol (BREO ELLIPTA) 100-25 MCG/ACT AEPB TAKE 1 PUFF BY MOUTH EVERY DAY 60 each 2   No facility-administered medications prior to visit.    Review of Systems  Constitutional:  Negative for  chills, diaphoresis, fever, malaise/fatigue and weight loss.  HENT:  Negative for congestion.   Respiratory:  Positive for cough. Negative for hemoptysis, sputum production, shortness of breath and wheezing.   Cardiovascular:  Negative for chest pain, palpitations and leg swelling.     Objective:   Vitals:   09/22/22 0808  BP: 118/88  Pulse: 76  Resp: 16  SpO2: 97%  Weight: 237 lb 12.8 oz (107.9 kg)  Height: 5\' 5"  (1.651 m)   SpO2: 97 %  Physical Exam: General: Well-appearing, no acute distress HENT: [place], AT Eyes: EOMI, no scleral icterus Respiratory:  Clear to auscultation bilaterally.  No crackles, wheezing or rales Cardiovascular: RRR, -M/R/G, no JVD Extremities:-Edema,-tenderness Neuro: AAO x4, CNII-XII grossly intact Psych: Normal mood, normal affect  Data Reviewed:  Imaging: CXR 04/14/22 - No acute infiltrate effusion or edema CT A/P 09/01/22 - Visualized lower lung fields clear. Normal parenchyma  PFT: None on file  Labs: CBC    Component Value Date/Time   WBC 9.7 09/01/2022 1833   RBC 5.04 09/01/2022 1833   HGB 14.3 09/01/2022 2151   HCT 42.0 09/01/2022 2151   PLT 413 (H) 09/01/2022 1833   MCV 84.9 09/01/2022 1833   MCH 27.0 09/01/2022 1833   MCHC 31.8 09/01/2022 1833   RDW 14.9 09/01/2022 1833   LYMPHSABS 3.2 01/15/2019 1623   MONOABS 0.4 01/15/2019 1623   EOSABS 0.0 01/15/2019 1623   BASOSABS 0.1 01/15/2019 1623   Absolute eos 01/15/19 - 0     Assessment & Plan:   Discussion: 32 year old female never smoker with migraine who presents for evaluation for chronic cough.  Common causes of cough were discussed including upper airway cough syndrome, reflux and undiagnosed obstructive lung disease. We reviewed evaluation and management for cough as noted below. Discussed clinical course and management of asthma including bronchodilator regimen, preventive care including vaccinations and action plan for exacerbation. Will arrange for PFTs to determine if she needs to remain on maintenance inhalers. If normal consider seasonal use.   Cough variant asthma Chronic cough --ARRANGE for pulmonary function tests --CONTINUE Breo 100 ONE puff ONCE a day --CONTINUE Albuterol TWO puffs AS NEEDED for shortness of breath or wheezing   Health Maintenance Immunization History  Administered Date(s) Administered   Influenza-Unspecified 10/26/2019   PFIZER(Purple Top)SARS-COV-2 Vaccination 07/05/2019, 07/26/2019, 02/13/2020   Pfizer Covid-19 Vaccine Bivalent Booster 26yrs & up 11/14/2020   Tdap 03/02/2011, 08/04/2021   CT  Lung Screen - never smoker. Not qualified  Orders Placed This Encounter  Procedures   Pulmonary function test    Standing Status:   Future    Standing Expiration Date:   09/22/2023    Order Specific Question:   Where should this test be performed?    Answer:   [facility]    Order Specific Question:   Full PFT: includes the following: basic spirometry, spirometry pre & post bronchodilator, diffusion capacity (DLCO), lung volumes    Answer:   Full PFT   Meds ordered this encounter  Medications   fluticasone furoate-vilanterol (BREO ELLIPTA) 100-25 MCG/ACT AEPB    Sig: Inhale 1 puff into the lungs daily.    Dispense:  60 each    Refill:  2    Return for after PFT with visit after (video or inperson OK). F/u after PFT. Afterwards, ok for annual visit  I have spent a total time of 45-minutes on the day of the appointment reviewing prior documentation, coordinating care and discussing medical diagnosis and plan  with the patient/family. Imaging, labs and tests included in this note have been reviewed and interpreted independently by me.  Kailena Lubas Mechele Collin, MD [facility] 09/22/2022 8:44 AM  Office Number [phone]

## 2022-10-16 ENCOUNTER — Other Ambulatory Visit: Payer: Self-pay | Admitting: Family Medicine

## 2022-10-16 DIAGNOSIS — Z3041 Encounter for surveillance of contraceptive pills: Secondary | ICD-10-CM

## 2022-10-29 ENCOUNTER — Ambulatory Visit (HOSPITAL_BASED_OUTPATIENT_CLINIC_OR_DEPARTMENT_OTHER): Payer: 59 | Admitting: Pulmonary Disease

## 2022-10-29 DIAGNOSIS — R053 Chronic cough: Secondary | ICD-10-CM | POA: Diagnosis not present

## 2022-10-29 LAB — PULMONARY FUNCTION TEST
DL/VA % pred: 133 %
DL/VA: 6.03 ml/min/mmHg/L
DLCO cor % pred: 100 %
DLCO cor: 23.11 ml/min/mmHg
DLCO unc % pred: 102 %
DLCO unc: 23.72 ml/min/mmHg
FEF 25-75 Post: 3.04 L/s
FEF 25-75 Pre: 1.77 L/s
FEF2575-%Change-Post: 71 %
FEF2575-%Pred-Post: 88 %
FEF2575-%Pred-Pre: 51 %
FEV1-%Change-Post: 20 %
FEV1-%Pred-Post: 77 %
FEV1-%Pred-Pre: 63 %
FEV1-Post: 2.5 L
FEV1-Pre: 2.07 L
FEV1FVC-%Change-Post: 2 %
FEV1FVC-%Pred-Pre: 93 %
FEV6-%Change-Post: 17 %
FEV6-%Pred-Post: 81 %
FEV6-%Pred-Pre: 69 %
FEV6-Post: 3.11 L
FEV6-Pre: 2.65 L
FEV6FVC-%Change-Post: 0 %
FEV6FVC-%Pred-Post: 100 %
FEV6FVC-%Pred-Pre: 101 %
FVC-%Change-Post: 17 %
FVC-%Pred-Post: 80 %
FVC-%Pred-Pre: 68 %
FVC-Post: 3.12 L
FVC-Pre: 2.65 L
Post FEV1/FVC ratio: 80 %
Post FEV6/FVC ratio: 100 %
Pre FEV1/FVC ratio: 78 %
Pre FEV6/FVC Ratio: 100 %
RV % pred: 115 %
RV: 1.73 L
TLC % pred: 86 %
TLC: 4.51 L

## 2022-10-29 NOTE — Patient Instructions (Signed)
Full PFT Performed Today  

## 2022-10-29 NOTE — Progress Notes (Signed)
Full PFT Performed Today  

## 2022-11-03 ENCOUNTER — Encounter (HOSPITAL_BASED_OUTPATIENT_CLINIC_OR_DEPARTMENT_OTHER): Payer: Self-pay | Admitting: Pulmonary Disease

## 2022-11-03 ENCOUNTER — Telehealth (HOSPITAL_BASED_OUTPATIENT_CLINIC_OR_DEPARTMENT_OTHER): Payer: 59 | Admitting: Pulmonary Disease

## 2022-11-03 DIAGNOSIS — J4530 Mild persistent asthma, uncomplicated: Secondary | ICD-10-CM | POA: Diagnosis not present

## 2022-11-03 MED ORDER — FLUTICASONE FUROATE-VILANTEROL 200-25 MCG/ACT IN AEPB: 1.0000 | INHALATION_SPRAY | Freq: Every day | RESPIRATORY_TRACT | 5 refills | Status: DC

## 2022-11-03 NOTE — Progress Notes (Addendum)
Virtual Visit via Video Note  I connected with Regina Riley on 11/03/22 at  1:45 PM EDT by a video enabled telemedicine application and verified that I am speaking with the correct person using two identifiers.  Location: Patient: Home  Provider: [facility]   I discussed the limitations of evaluation and management by telemedicine and the availability of in person appointments. The patient expressed understanding and agreed to proceed.   I discussed the assessment and treatment plan with the patient. The patient was provided an opportunity to ask questions and all were answered. The patient agreed with the plan and demonstrated an understanding of the instructions.   The patient was advised to call back or seek an in-person evaluation if the symptoms worsen or if the condition fails to improve as anticipated.  I provided 30 minutes of non-face-to-face time during this encounter.   Tamira Ryland Mechele Collin, MD   Subjective:   PATIENT ID: Regina Riley GENDER: female DOB: [birth year], MRN: [id number]  Chief Complaint  Patient presents with   Follow-up    PFT    Reason for Visit: Follow-up chronic cough  Regina Riley is a 32 year old female never smoker with asthma and migraine who presents for follow-up for chronic cough.  Initial consult She was seen by her PCP, Dr. Carmelia Roller on 07/05/22 for nonproductive cough associated with wheezing and shortness of breath. Reported possible mold exposure at work with worsening symptoms there. Denied reflux. Not on ACE inhibitor. Reported trial of Qvar without improvement. Started on [place]. Referred to Pulmonary for evaluation.  She reports the cough began around 10 months ago (October 2023) when she moved into a new building for work. She would have cough, headache and wheezing to the point when she felt like she had a constant viral illness. Cough is intermittently productive. Some question about ventilation in the  building. She continues to work there and noticed when she was gone for week long vacations where her symptoms seemed to improve. Has taken Breo 100 for one month. Cough has improved by 75%. She reports work vents have been recently cleaned. Denies allergies or reflux issues. Did have have covid last month but this has resolved.  Denies childhood asthma however had pneumonia/bronchitis annually. No current pets. Previously had outdoor dog when she was child  11/03/22 Video Visit She reports cough is unchanged and occurs after exertion. But it is less productive since starting Breo. 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 Denies shortness of breath or wheezing.   Asthma Control Test ACT Total Score  09/22/2022  8:29 AM 19    Social History: Charity fundraiser at [employer]   Past Medical History:  Diagnosis Date   Alopecia areata    Migraine    Obesity, Class II, BMI 35-39.9    Urticaria      Family History  Problem Relation Age of Onset   Asthma Mother    Hypertension Father    Hyperlipidemia Father    Diabetes Father    Heart attack Maternal Grandfather 73   Heart disease Paternal Grandfather      Social History   Occupational History   Not on file  Tobacco Use   Smoking status: Never    Passive exposure: Never   Smokeless tobacco: Never  Vaping Use   Vaping status: Never Used  Substance and Sexual Activity   Alcohol use: Not  Currently   Drug use: Not Currently   Sexual activity: Yes    Birth control/protection: Pill    No Known Allergies   Outpatient Medications Prior to Visit  Medication Sig Dispense Refill   albuterol (VENTOLIN HFA) 108 (90 Base) MCG/ACT inhaler Inhale 1-2 puffs into the lungs every 6 (six) hours as needed for wheezing or shortness of breath.     norethindrone (MICRONOR) 0.35 MG tablet TAKE 1 TABLET BY MOUTH EVERY DAY 84 tablet 1   rizatriptan (MAXALT) 10  MG tablet Take 1 tablet (10 mg total) by mouth as needed for migraine. May repeat in 2 hours if needed 10 tablet 2   venlafaxine XR (EFFEXOR-XR) 75 MG 24 hr capsule TAKE 1 CAPSULE BY MOUTH DAILY WITH BREAKFAST. 90 capsule 2   fluticasone furoate-vilanterol (BREO ELLIPTA) 100-25 MCG/ACT AEPB Inhale 1 puff into the lungs daily. 60 each 2   No facility-administered medications prior to visit.    Review of Systems  Constitutional:  Negative for chills, diaphoresis, fever, malaise/fatigue and weight loss.  HENT:  Negative for congestion.   Respiratory:  Positive for cough. Negative for hemoptysis, sputum production, shortness of breath and wheezing.   Cardiovascular:  Negative for chest pain, palpitations and leg swelling.     Objective:   There were no vitals filed for this visit.     Physical Exam: General: Well-appearing, no acute distress HENT: [place], AT Eyes: EOMI, no scleral icterus Respiratory: No respiratory distress Neuro: AAO x4, CNII-XII grossly intact Psych: Normal mood, normal affect   Data Reviewed:  Imaging: CXR 04/14/22 - No acute infiltrate effusion or edema CT A/P 09/01/22 - Visualized lower lung fields clear. Normal parenchyma  PFT: 10/29/22 FVC 3.12 (80%) FEV1 2.50 (77%) Ratio 80  TLC 86% DLCO 102%. Post BD FVC change - 17%. Post FEV1 change 20% Interpretation: Normal PFTs. Significant bronchodilator response consistent with asthma  Labs: CBC    Component Value Date/Time   WBC 9.7 09/01/2022 1833   RBC 5.04 09/01/2022 1833   HGB 14.3 09/01/2022 2151   HCT 42.0 09/01/2022 2151   PLT 413 (H) 09/01/2022 1833   MCV 84.9 09/01/2022 1833   MCH 27.0 09/01/2022 1833   MCHC 31.8 09/01/2022 1833   RDW 14.9 09/01/2022 1833   LYMPHSABS 3.2 01/15/2019 1623   MONOABS 0.4 01/15/2019 1623   EOSABS 0.0 01/15/2019 1623   BASOSABS 0.1 01/15/2019 1623   Absolute eos 01/15/19 - 0     Assessment & Plan:   Discussion: 32 year old female never smoker with asthma and  migraine who presents for PFT follow-up. PFTs reviewed and consistent with asthma. Continues to have persistent cough that is less productive. Will increase ICS/LABA  Mild persistent asthma Cough variant asthma --Reviewed PFTs which are consistent with asthma --INCREASE Breo 200 ONE puff ONCE a day --CONTINUE Albuterol TWO puffs AS NEEDED for shortness of breath or wheezing   Health Maintenance Immunization History  Administered Date(s) Administered   Influenza-Unspecified 10/26/2019   PFIZER(Purple Top)SARS-COV-2 Vaccination 07/05/2019, 07/26/2019, 02/13/2020   Pfizer Covid-19 Vaccine Bivalent Booster 1yrs & up 11/14/2020   Tdap 03/02/2011, 08/04/2021   CT Lung Screen - never smoker. Not qualified  No orders of the defined types were placed in this encounter.  Meds ordered this encounter  Medications   fluticasone furoate-vilanterol (BREO ELLIPTA) 200-25 MCG/ACT AEPB    Sig: Inhale 1 puff into the lungs daily.    Dispense:  60 each    Refill:  5  Return for January 2024.   I have spent a total time of 30-minutes on the day of the appointment including chart review, data review, collecting history, coordinating care and discussing medical diagnosis and plan with the patient/family. Past medical history, allergies, medications were reviewed. Pertinent imaging, labs and tests included in this note have been reviewed and interpreted independently by me.  Gaylyn Berish Mechele Collin, MD [facility] 11/03/2022 1:52 PM  Office Number [phone]

## 2022-11-03 NOTE — Patient Instructions (Signed)
Mild persistent asthma Cough variant asthma --Reviewed PFTs which are consistent with asthma --INCREASE Breo 200 ONE puff ONCE a day --CONTINUE Albuterol TWO puffs AS NEEDED for shortness of breath or wheezing

## 2022-12-13 ENCOUNTER — Telehealth: Payer: 59 | Admitting: Physician Assistant

## 2022-12-13 DIAGNOSIS — J4521 Mild intermittent asthma with (acute) exacerbation: Secondary | ICD-10-CM | POA: Diagnosis not present

## 2022-12-13 DIAGNOSIS — J069 Acute upper respiratory infection, unspecified: Secondary | ICD-10-CM

## 2022-12-13 MED ORDER — ALBUTEROL SULFATE HFA 108 (90 BASE) MCG/ACT IN AERS
2.0000 | INHALATION_SPRAY | Freq: Four times a day (QID) | RESPIRATORY_TRACT | 0 refills | Status: DC | PRN
Start: 2022-12-13 — End: 2023-12-21

## 2022-12-13 MED ORDER — PREDNISONE 20 MG PO TABS: 40.0000 mg | ORAL_TABLET | Freq: Every day | ORAL | 0 refills | Status: AC

## 2022-12-13 MED ORDER — AZITHROMYCIN 250 MG PO TABS: ORAL_TABLET | ORAL | 0 refills | Status: AC

## 2022-12-13 NOTE — Progress Notes (Signed)
Virtual Visit Consent   Regina Riley, you are scheduled for a virtual visit with a [facility] provider today. Just as with appointments in the office, your consent must be obtained to participate. Your consent will be active for this visit and any virtual visit you may have with one of our providers in the next 365 days. If you have a MyChart account, a copy of this consent can be sent to you electronically.  As this is a virtual visit, video technology does not allow for your provider to perform a traditional examination. This may limit your provider's ability to fully assess your condition. If your provider identifies any concerns that need to be evaluated in person or the need to arrange testing (such as labs, EKG, etc.), we will make arrangements to do so. Although advances in technology are sophisticated, we cannot ensure that it will always work on either your end or our end. If the connection with a video visit is poor, the visit may have to be switched to a telephone visit. With either a video or telephone visit, we are not always able to ensure that we have a secure connection.  By engaging in this virtual visit, you consent to the provision of healthcare and authorize for your insurance to be billed (if applicable) for the services provided during this visit. Depending on your insurance coverage, you may receive a charge related to this service.  I need to obtain your verbal consent now. Are you willing to proceed with your visit today? Regina Riley has provided verbal consent on 12/13/2022 for a virtual visit (video or telephone). Tylene Fantasia Ward, PA-C  Date: 12/13/2022 7:18 PM  Virtual Visit via Video Note   I, Tylene Fantasia Ward, connected with  Regina Riley  ([id number], 05-04-1990) on 12/13/22 at  7:15 PM EST by a video-enabled telemedicine application and verified that I am speaking with the correct person using two identifiers.  Location: Patient: Virtual Visit Location  Patient: Home Provider: Virtual Visit Location Provider: Home Office   I discussed the limitations of evaluation and management by telemedicine and the availability of in person appointments. The patient expressed understanding and agreed to proceed.    History of Present Illness: Regina Riley is a 32 y.o. who identifies as a female who was assigned female at birth, and is being seen today for cough, congestion that started about 5 days ago. She has a h/o asthma and reports worsening of wheezing in the last few days.  She has been using her rescue inhaler with some improvement.  Denies shortness of breath.  Denies fever.  She has been taking otc cold and cough medications with minimal improvement.   HPI: HPI  Problems:  Patient Active Problem List   Diagnosis Date Noted   Mild persistent asthma without complication 11/03/2022   Cough variant asthma 09/22/2022   Chronic cough 07/05/2022   Migraine with aura and without status migrainosus, not intractable 02/05/2022   COVID-19 02/22/2020   Migraine without aura and without status migrainosus, not intractable 11/28/2019   ASCUS of cervix with negative high risk HPV 11/10/2017   History of recurrent UTI (urinary tract infection) 11/01/2017    Allergies: No Known Allergies Medications:  Current Outpatient Medications:    albuterol (VENTOLIN HFA) 108 (90 Base) MCG/ACT inhaler, Inhale 2 puffs into the lungs every 6 (six) hours as needed for wheezing or shortness of breath., Disp: 8 g, Rfl: 0   azithromycin (ZITHROMAX) 250 MG tablet, Take 2 tablets on  day 1, then 1 tablet daily on days 2 through 5, Disp: 6 tablet, Rfl: 0   predniSONE (DELTASONE) 20 MG tablet, Take 2 tablets (40 mg total) by mouth daily with breakfast for 5 days., Disp: 10 tablet, Rfl: 0   fluticasone furoate-vilanterol (BREO ELLIPTA) 200-25 MCG/ACT AEPB, Inhale 1 puff into the lungs daily., Disp: 60 each, Rfl: 5   norethindrone (MICRONOR) 0.35 MG tablet, TAKE 1 TABLET BY  MOUTH EVERY DAY, Disp: 84 tablet, Rfl: 1   rizatriptan (MAXALT) 10 MG tablet, Take 1 tablet (10 mg total) by mouth as needed for migraine. May repeat in 2 hours if needed, Disp: 10 tablet, Rfl: 2   venlafaxine XR (EFFEXOR-XR) 75 MG 24 hr capsule, TAKE 1 CAPSULE BY MOUTH DAILY WITH BREAKFAST., Disp: 90 capsule, Rfl: 2  Observations/Objective: Patient is well-developed, well-nourished in no acute distress.  Resting comfortably at home.  Head is normocephalic, atraumatic.  No labored breathing.  Speech is clear and coherent with logical content.  Patient is alert and oriented at baseline.    Assessment and Plan: 1. Viral upper respiratory tract infection  2. Mild intermittent asthma with exacerbation  Albuterol inhaler refilled.  Antibiotic and prednisone prescribed.  Pt in no acute distress, speaking in complete sentences.  Supportive care discussed.   Follow Up Instructions: I discussed the assessment and treatment plan with the patient. The patient was provided an opportunity to ask questions and all were answered. The patient agreed with the plan and demonstrated an understanding of the instructions.  A copy of instructions were sent to the patient via MyChart unless otherwise noted below.     The patient was advised to call back or seek an in-person evaluation if the symptoms worsen or if the condition fails to improve as anticipated.    Tylene Fantasia Ward, PA-C

## 2022-12-13 NOTE — Patient Instructions (Signed)
Johnnette Litter, thank you for joining Myrla Halsted, PA-C for today's virtual visit.  While this provider is not your primary care provider (PCP), if your PCP is located in our provider database this encounter information will be shared with them immediately following your visit.   A [facility] MyChart account gives you access to today's visit and all your visits, tests, and labs performed at [facility] " click here if you don't have a [facility] MyChart account or go to [email or web address]  Consent: (Patient) Pearlie Das provided verbal consent for this virtual visit at the beginning of the encounter.  Current Medications:  Current Outpatient Medications:    albuterol (VENTOLIN HFA) 108 (90 Base) MCG/ACT inhaler, Inhale 2 puffs into the lungs every 6 (six) hours as needed for wheezing or shortness of breath., Disp: 8 g, Rfl: 0   azithromycin (ZITHROMAX) 250 MG tablet, Take 2 tablets on day 1, then 1 tablet daily on days 2 through 5, Disp: 6 tablet, Rfl: 0   predniSONE (DELTASONE) 20 MG tablet, Take 2 tablets (40 mg total) by mouth daily with breakfast for 5 days., Disp: 10 tablet, Rfl: 0   fluticasone furoate-vilanterol (BREO ELLIPTA) 200-25 MCG/ACT AEPB, Inhale 1 puff into the lungs daily., Disp: 60 each, Rfl: 5   norethindrone (MICRONOR) 0.35 MG tablet, TAKE 1 TABLET BY MOUTH EVERY DAY, Disp: 84 tablet, Rfl: 1   rizatriptan (MAXALT) 10 MG tablet, Take 1 tablet (10 mg total) by mouth as needed for migraine. May repeat in 2 hours if needed, Disp: 10 tablet, Rfl: 2   venlafaxine XR (EFFEXOR-XR) 75 MG 24 hr capsule, TAKE 1 CAPSULE BY MOUTH DAILY WITH BREAKFAST., Disp: 90 capsule, Rfl: 2   Medications ordered in this encounter:  Meds ordered this encounter  Medications   predniSONE (DELTASONE) 20 MG tablet    Sig: Take 2 tablets (40 mg total) by mouth daily with breakfast for 5 days.    Dispense:  10 tablet    Refill:  0    Order Specific Question:   Supervising  Provider    Answer:   Merrilee Jansky [[id number]]   azithromycin (ZITHROMAX) 250 MG tablet    Sig: Take 2 tablets on day 1, then 1 tablet daily on days 2 through 5    Dispense:  6 tablet    Refill:  0    Order Specific Question:   Supervising Provider    Answer:   Merrilee Jansky [[id number]]   albuterol (VENTOLIN HFA) 108 (90 Base) MCG/ACT inhaler    Sig: Inhale 2 puffs into the lungs every 6 (six) hours as needed for wheezing or shortness of breath.    Dispense:  8 g    Refill:  0    Order Specific Question:   Supervising Provider    Answer:   Merrilee Jansky [id number]     *If you need refills on other medications prior to your next appointment, please contact your pharmacy*  Follow-Up: Call back or seek an in-person evaluation if the symptoms worsen or if the condition fails to improve as anticipated.  [facility] [phone]  Other Instructions Take antibiotic as prescribed.  Take prednisone as prescribed.  Use inhaler as needed.  If no improvement or symptoms become worse please go to an Urgent Care for further evaluation.    If you have been instructed to have an in-person evaluation today at a local Urgent Care facility, please use the link below. It will take  you to a list of all of our available [facility], including address, phone number and hours of operation. Please do not delay care.  [facility]  If you or a family member do not have a primary care provider, use the link below to schedule a visit and establish care. When you choose a [facility] primary care physician or advanced practice provider, you gain a long-term partner in health. Find a Primary Care Provider  Learn more about [facility]'s in-office and virtual care options: [facility] - Get Care Now

## 2023-03-07 ENCOUNTER — Ambulatory Visit: Payer: 59 | Admitting: Family Medicine

## 2023-03-21 ENCOUNTER — Encounter: Payer: Self-pay | Admitting: Family Medicine

## 2023-03-21 ENCOUNTER — Ambulatory Visit (INDEPENDENT_AMBULATORY_CARE_PROVIDER_SITE_OTHER): Payer: Managed Care, Other (non HMO) | Admitting: Family Medicine

## 2023-03-21 VITALS — BP 120/84 | HR 86 | Temp 98.0°F | Resp 16 | Ht 65.0 in | Wt 236.8 lb

## 2023-03-21 DIAGNOSIS — J4540 Moderate persistent asthma, uncomplicated: Secondary | ICD-10-CM | POA: Diagnosis not present

## 2023-03-21 DIAGNOSIS — G43109 Migraine with aura, not intractable, without status migrainosus: Secondary | ICD-10-CM

## 2023-03-21 MED ORDER — VENLAFAXINE HCL ER 75 MG PO CP24
75.0000 mg | ORAL_CAPSULE | Freq: Every day | ORAL | 2 refills | Status: DC
Start: 2023-03-21 — End: 2023-06-22

## 2023-03-21 MED ORDER — MONTELUKAST SODIUM 10 MG PO TABS: 10.0000 mg | ORAL_TABLET | Freq: Every day | ORAL | 3 refills | Status: DC

## 2023-03-21 NOTE — Patient Instructions (Signed)
 Keep the diet clean and stay active.  Continue the Breo. Please follow up with Dr. Everardo All.   Let us know if you need anything.

## 2023-03-21 NOTE — Progress Notes (Signed)
 Chief Complaint  Patient presents with   Follow-up    Follow up    Subjective: Patient is a 33 y.o. female here for f/u.  Hx of migraines. She is taking Effexor XR 75 mg/d. Compliant, no AE's. 1 headache in last mo. takes ibuprofen for abortive therapy which is helpful.  She is no longer taking Maxalt.  The patient has a history of moderate persistent asthma.  She is managed by the pulmonology team.  She is currently taking Breo once daily.  She reports compliance and no adverse effects.  She had the flu a few weeks ago.  Breathing is back to normal and she is no longer wheezing.  She is still coughing more than usual.  Albuterol will help intermittently.  Past Medical History:  Diagnosis Date   Alopecia areata    Migraine    Obesity, Class II, BMI 35-39.9    Urticaria     Objective: BP 120/84 (BP Location: Left Arm, Patient Position: Sitting)   Pulse 86   Temp 98 F (36.7 C) (Oral)   Resp 16   Ht 5\' 5"  (1.651 m)   Wt 236 lb 12.8 oz (107.4 kg)   SpO2 95%   BMI 39.41 kg/m  General: Awake, appears stated age Heart: RRR, no LE edema Lungs: CTAB, no rales, wheezes or rhonchi. No accessory muscle use Neuro: DTRs equal and symmetric throughout, no clonus, no cerebellar signs, 5/5 strength, gait is normal MSK: No TTP over the suboccipital triangle or cervical paraspinal musculature bilaterally Psych: Age appropriate judgment and insight, normal affect and mood  Assessment and Plan: Migraine with aura and without status migrainosus, not intractable - Plan: venlafaxine XR (EFFEXOR-XR) 75 MG 24 hr capsule  Moderate persistent asthma without complication - Plan: montelukast (SINGULAIR) 10 MG tablet  Chronic, stable.  Continue Effexor XR 75 mg daily.  Continue ibuprofen as needed. Chronic, not currently stable.  Continue Breo as prescribed by pulmonology.  Add Singulair 10 mg daily.  She has an appointment coming up with the pulmonology team. I will see her in 6 months for a  physical or as needed. The patient voiced understanding and agreement to the plan.  Jilda Roche [place], DO 03/21/23  8:28 AM

## 2023-03-29 ENCOUNTER — Telehealth: Payer: Self-pay

## 2023-03-29 NOTE — Telephone Encounter (Signed)
 Does patient needs BP check?

## 2023-03-29 NOTE — Telephone Encounter (Signed)
 Copied from CRM [id number]. Topic: Appointments - Appointment Cancel/Reschedule >> Mar 29, 2023  9:14 AM Regina Riley H wrote: Patient states she's not aware of any appointment for a blood pressure check and wants to cancel appointment for 3/11, patient states she does not have any blood pressure issues. Reached out to [facility] to confirm reason for visit and wasn't able to confirm patient had blood pressure issues. If for some reason you all have more information as to why patient needed appointment please reach out to her, thanks.  Regina Riley [phone]

## 2023-03-29 NOTE — Telephone Encounter (Signed)
 No. I was planning on seeing her in August unless she needs [place].

## 2023-03-30 ENCOUNTER — Ambulatory Visit

## 2023-04-02 ENCOUNTER — Other Ambulatory Visit: Payer: Self-pay | Admitting: Family Medicine

## 2023-04-02 DIAGNOSIS — Z3041 Encounter for surveillance of contraceptive pills: Secondary | ICD-10-CM

## 2023-04-05 ENCOUNTER — Ambulatory Visit: Payer: Managed Care, Other (non HMO)

## 2023-05-19 ENCOUNTER — Encounter (HOSPITAL_BASED_OUTPATIENT_CLINIC_OR_DEPARTMENT_OTHER): Payer: Self-pay | Admitting: Pulmonary Disease

## 2023-05-19 ENCOUNTER — Other Ambulatory Visit (HOSPITAL_BASED_OUTPATIENT_CLINIC_OR_DEPARTMENT_OTHER): Payer: Self-pay

## 2023-05-19 ENCOUNTER — Ambulatory Visit (HOSPITAL_BASED_OUTPATIENT_CLINIC_OR_DEPARTMENT_OTHER): Admitting: Pulmonary Disease

## 2023-05-19 VITALS — BP 124/80 | HR 89 | Ht 65.0 in | Wt 238.4 lb

## 2023-05-19 DIAGNOSIS — J4530 Mild persistent asthma, uncomplicated: Secondary | ICD-10-CM

## 2023-05-19 MED ORDER — BREZTRI AEROSPHERE 160-9-4.8 MCG/ACT IN AERO: 2.0000 | INHALATION_SPRAY | Freq: Two times a day (BID) | RESPIRATORY_TRACT | 5 refills | Status: DC

## 2023-05-19 NOTE — Progress Notes (Signed)
 Subjective:   PATIENT ID: Regina Riley GENDER: female DOB: [birth year], MRN: [id number]  Chief Complaint  Patient presents with   Follow-up    Asthma    Reason for Visit: Follow-up chronic cough  Regina Riley is a 33 year old female never smoker with asthma and migraine who presents for follow-up for chronic cough.  Initial consult She was seen by her PCP, Dr. Gwenette Lennox on 07/05/22 for nonproductive cough associated with wheezing and shortness of breath. Reported possible mold exposure at work with worsening symptoms there. Denied reflux. Not on ACE inhibitor. Reported trial of Qvar without improvement. Started on Breo. Referred to Pulmonary for evaluation.  She reports the cough began around 10 months ago (October 2023) when she moved into a new building for work. She would have cough, headache and wheezing to the point when she felt like she had a constant viral illness. Cough is intermittently productive. Some question about ventilation in the building. She continues to work there and noticed when she was gone for week long vacations where her symptoms seemed to improve. Has taken Breo 100 for one month. Cough has improved by 75%. She reports work vents have been recently cleaned. Denies allergies or reflux issues. Did have have covid last month but this has resolved.  Denies childhood asthma however had pneumonia/bronchitis annually. No current pets. Previously had outdoor dog when she was child  11/03/22 Video Visit She reports cough is unchanged and occurs after exertion. But it is less productive since starting Breo. 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 Denies shortness of breath or wheezing.   05/19/23 Since our last visit she ran out of her Breo over one month and only taking albuterol  as needed. Has not picked up another inhaler due to cost, unclear if her insurance is no longer  covering. Has not needed it and avoiding mold and other triggers. Surprisingly the pollen is not too triggering. Denies shortness of breath, cough, wheezing, chest tightness. Denies nocturnal symptoms.   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  Past Medical History:  Diagnosis Date   Alopecia areata    Migraine    Obesity, Class II, BMI 35-39.9    Urticaria      Family History  Problem Relation Age of Onset   Asthma Mother    Hypertension Father    Hyperlipidemia Father    Diabetes Father    Heart attack Maternal Grandfather 24   Heart disease Paternal Grandfather      Social History   Occupational History   Not on file  Tobacco Use   Smoking status: Never    Passive exposure: Never   Smokeless tobacco: Never  Vaping Use   Vaping status: Never Used  Substance and Sexual Activity   Alcohol use: Not Currently   Drug use: Not Currently   Sexual activity: Yes    Birth control/protection: Pill    No Known Allergies   Outpatient Medications Prior to Visit  Medication Sig Dispense Refill   albuterol  (VENTOLIN  HFA) 108 (90 Base) MCG/ACT inhaler Inhale 2 puffs into the lungs every 6 (six) hours as needed for wheezing or shortness of breath. 8 g 0   montelukast  (SINGULAIR ) 10 MG tablet Take 1 tablet (10 mg total) by mouth at bedtime. 30  tablet 3   norethindrone  (MICRONOR ) 0.35 MG tablet TAKE 1 TABLET BY MOUTH EVERY DAY 84 tablet 1   venlafaxine  XR (EFFEXOR -XR) 75 MG 24 hr capsule Take 1 capsule (75 mg total) by mouth daily with breakfast. 90 capsule 2   fluticasone  furoate-vilanterol (BREO ELLIPTA ) 200-25 MCG/ACT AEPB Inhale 1 puff into the lungs daily. (Patient not taking: Reported on 05/19/2023) 60 each 5   No facility-administered medications prior to visit.    Review of Systems  Constitutional:  Negative for chills, diaphoresis, fever, malaise/fatigue and weight loss.   HENT:  Negative for congestion.   Respiratory:  Negative for cough, hemoptysis, sputum production, shortness of breath and wheezing.   Cardiovascular:  Negative for chest pain, palpitations and leg swelling.     Objective:   Vitals:   05/19/23 0819  BP: 124/80  Pulse: 89  SpO2: 95%  Weight: 238 lb 6.4 oz (108.1 kg)  Height: 5\' 5"  (1.651 m)    SpO2: 95 %  Physical Exam: General: Well-appearing, no acute distress HENT: [place], AT Eyes: EOMI, no scleral icterus Respiratory: Clear to auscultation bilaterally.  No crackles, wheezing or rales Cardiovascular: RRR, -M/R/G, no JVD Extremities:-Edema,-tenderness Neuro: AAO x4, CNII-XII grossly intact Psych: Normal mood, normal affect  Data Reviewed:  Imaging: CT A/P 09/01/22 - Visualized lower lung fields clear. Normal parenchyma CXR 04/14/22 - No acute infiltrate effusion or edema  PFT: 10/29/22 FVC 3.12 (80%) FEV1 2.50 (77%) Ratio 80  TLC 86% DLCO 102%. Post BD FVC change - 17%. Post FEV1 change 20% Interpretation: Normal PFTs. Significant bronchodilator response consistent with asthma  Labs: CBC    Component Value Date/Time   WBC 9.7 09/01/2022 1833   RBC 5.04 09/01/2022 1833   HGB 14.3 09/01/2022 2151   HCT 42.0 09/01/2022 2151   PLT 413 (H) 09/01/2022 1833   MCV 84.9 09/01/2022 1833   MCH 27.0 09/01/2022 1833   MCHC 31.8 09/01/2022 1833   RDW 14.9 09/01/2022 1833   LYMPHSABS 3.2 01/15/2019 1623   MONOABS 0.4 01/15/2019 1623   EOSABS 0.0 01/15/2019 1623   BASOSABS 0.1 01/15/2019 1623   Absolute eos 01/15/19 - 0     Assessment & Plan:   Discussion: 33 year old female never smoker with asthma and migraine who presents for follow-up. Has been compliant with ICS/LABA until last month. Currently asymptomatic on singulair  alone. Reasonable to hold inhalers until symptoms recur. Her asthma may be seasonal and can restart as needed. Pharmacy investigation performed and interestingly her insurance has kept her ICS/LABA at  higher costs than ICS/LABA/LAMA.  Advair: $193, Symbicort, $181.33, Dulera $323, Breztri : $35. After discussion, patient opted for Breztri .  Mild persistent asthma - good control off inhalers Cough variant asthma --RESTART Breztri  TWO puffs in morning and evening. Rinse out mouth after use --CONTINUE Albuterol  TWO puffs AS NEEDED for shortness of breath or wheezing --CONTINUE singulair  10 mg daily  Health Maintenance Immunization History  Administered Date(s) Administered   Influenza-Unspecified 10/26/2019   PFIZER(Purple Top)SARS-COV-2 Vaccination 07/05/2019, 07/26/2019, 02/13/2020   Pfizer Covid-19 Vaccine Bivalent Booster 74yrs & up 11/14/2020   Tdap 03/02/2011, 08/04/2021   CT Lung Screen - never smoker. Not qualified  No orders of the defined types were placed in this encounter.  Meds ordered this encounter  Medications   budeson-glycopyrrolate-formoterol (BREZTRI  AEROSPHERE) 160-9-4.8 MCG/ACT AERO inhaler    Sig: Inhale 2 puffs into the lungs in the morning and at bedtime.    Dispense:  10.7 g    Refill:  5  Return in about 6 months (around 11/18/2023).   I have spent a total time of 30-minutes on the day of the appointment including chart review, data review, collecting history, coordinating care and discussing medical diagnosis and plan with the patient/family. Past medical history, allergies, medications were reviewed. Pertinent imaging, labs and tests included in this note have been reviewed and interpreted independently by me.  Idus Rathke Genetta Kenning, MD [facility] 05/19/2023 8:54 AM

## 2023-05-19 NOTE — Patient Instructions (Signed)
 Mild persistent asthma - good control off inhalers Cough variant asthma --RESTART Breztri  TWO puffs in morning and evening. Rinse out mouth after use --CONTINUE Albuterol  TWO puffs AS NEEDED for shortness of breath or wheezing --CONTINUE singulair  10 mg daily

## 2023-06-22 ENCOUNTER — Encounter: Payer: Self-pay | Admitting: Family Medicine

## 2023-06-22 ENCOUNTER — Ambulatory Visit (INDEPENDENT_AMBULATORY_CARE_PROVIDER_SITE_OTHER): Admitting: Family Medicine

## 2023-06-22 VITALS — BP 130/82 | HR 87 | Temp 98.0°F | Resp 16 | Ht 65.0 in | Wt 246.0 lb

## 2023-06-22 DIAGNOSIS — F411 Generalized anxiety disorder: Secondary | ICD-10-CM

## 2023-06-22 MED ORDER — PROPRANOLOL HCL 10 MG PO TABS
ORAL_TABLET | ORAL | 1 refills | Status: DC
Start: 2023-06-22 — End: 2023-07-25

## 2023-06-22 MED ORDER — VENLAFAXINE HCL ER 150 MG PO CP24
150.0000 mg | ORAL_CAPSULE | Freq: Every day | ORAL | 2 refills | Status: DC
Start: 2023-06-22 — End: 2023-07-14

## 2023-06-22 NOTE — Patient Instructions (Signed)
If you do not hear anything about your referral in the next 1-2 weeks, call our office and ask for an update.  Please consider counseling. Contact [phone] to schedule an appointment or inquire about cost/insurance coverage.  [facility] located at [address], [place], [place].  Phone number = [phone].  Dr. Onoriode Edeh - Adult Psychiatry.    [facility] located at [address], [place], [place]. Phone number = [phone].   [facility] located at [address], [place], [place].  Phone number = [phone].   [facility] located at [address], [place], [place].  Phone number = [phone].  Aim to do some physical exertion for 150 minutes per week. This is typically divided into 5 days per week, 30 minutes per day. The activity should be enough to get your heart rate up. Anything is better than nothing if you have time constraints.  Let us know if you need anything. 
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
## 2023-06-26 ENCOUNTER — Other Ambulatory Visit: Payer: Self-pay | Admitting: Family Medicine

## 2023-06-26 DIAGNOSIS — J4540 Moderate persistent asthma, uncomplicated: Secondary | ICD-10-CM

## 2023-07-11 ENCOUNTER — Ambulatory Visit (INDEPENDENT_AMBULATORY_CARE_PROVIDER_SITE_OTHER): Admitting: Licensed Clinical Social Worker

## 2023-07-11 DIAGNOSIS — F4323 Adjustment disorder with mixed anxiety and depressed mood: Secondary | ICD-10-CM
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
## 2023-07-13 NOTE — Addendum Note (Signed)
 Addended by: Orbie Binder on: 07/13/2023 05:03 PM   Modules accepted: Level of Service

## 2023-07-14 ENCOUNTER — Other Ambulatory Visit: Payer: Self-pay | Admitting: Family Medicine

## 2023-07-14 DIAGNOSIS — F411 Generalized anxiety disorder: Secondary | ICD-10-CM

## 2023-07-19 ENCOUNTER — Ambulatory Visit: Admitting: Licensed Clinical Social Worker

## 2023-07-25 ENCOUNTER — Ambulatory Visit (INDEPENDENT_AMBULATORY_CARE_PROVIDER_SITE_OTHER): Admitting: Family Medicine

## 2023-07-25 VITALS — BP 126/80 | HR 86 | Temp 98.0°F | Resp 16 | Ht 65.0 in | Wt 242.8 lb

## 2023-07-25 DIAGNOSIS — F411 Generalized anxiety disorder: Secondary | ICD-10-CM | POA: Insufficient documentation

## 2023-07-25 MED ORDER — PROPRANOLOL HCL 20 MG PO TABS: 20.0000 mg | ORAL_TABLET | Freq: Three times a day (TID) | ORAL | 1 refills | Status: AC

## 2023-07-25 NOTE — Patient Instructions (Signed)
 Please continue with therapy.  Stay active.  Let us  know if you need anything.
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
## 2023-07-28 ENCOUNTER — Ambulatory Visit: Payer: Self-pay | Admitting: Licensed Clinical Social Worker

## 2023-08-05 ENCOUNTER — Ambulatory Visit (INDEPENDENT_AMBULATORY_CARE_PROVIDER_SITE_OTHER): Admitting: Licensed Clinical Social Worker

## 2023-08-05 DIAGNOSIS — F4323 Adjustment disorder with mixed anxiety and depressed mood: Secondary | ICD-10-CM
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
## 2023-08-26 ENCOUNTER — Ambulatory Visit: Payer: Self-pay | Admitting: Licensed Clinical Social Worker

## 2023-09-15 ENCOUNTER — Other Ambulatory Visit: Payer: Self-pay | Admitting: Family Medicine

## 2023-09-15 DIAGNOSIS — Z3041 Encounter for surveillance of contraceptive pills: Secondary | ICD-10-CM

## 2023-09-19 ENCOUNTER — Encounter: Payer: Managed Care, Other (non HMO) | Admitting: Family Medicine

## 2023-10-12 ENCOUNTER — Ambulatory Visit: Payer: Self-pay | Admitting: Family Medicine

## 2023-10-12 ENCOUNTER — Encounter: Payer: Self-pay | Admitting: Family Medicine

## 2023-10-12 VITALS — BP 128/74 | HR 88 | Temp 98.0°F | Resp 16 | Ht 65.0 in | Wt 246.8 lb

## 2023-10-12 DIAGNOSIS — Z23 Encounter for immunization: Secondary | ICD-10-CM | POA: Diagnosis not present

## 2023-10-12 DIAGNOSIS — Z0000 Encounter for general adult medical examination without abnormal findings: Secondary | ICD-10-CM | POA: Diagnosis not present

## 2023-10-12 LAB — COMPREHENSIVE METABOLIC PANEL WITH GFR
ALT: 20 U/L (ref 0–35)
AST: 18 U/L (ref 0–37)
Albumin: 3.8 g/dL (ref 3.5–5.2)
Alkaline Phosphatase: 91 U/L (ref 39–117)
BUN: 7 mg/dL (ref 6–23)
CO2: 23 meq/L (ref 19–32)
Calcium: 9 mg/dL (ref 8.4–10.5)
Chloride: 108 meq/L (ref 96–112)
Creatinine, Ser: 0.51 mg/dL (ref 0.40–1.20)
GFR: 121.47 mL/min (ref >60.00)
Glucose, Bld: 74 mg/dL (ref 70–99)
Potassium: 4 meq/L (ref 3.5–5.1)
Sodium: 141 meq/L (ref 135–145)
Total Bilirubin: 1 mg/dL (ref 0.2–1.2)
Total Protein: 7 g/dL (ref 6.0–8.3)

## 2023-10-12 LAB — CBC
HCT: 45.5 % (ref 36.0–46.0)
Hemoglobin: 14.6 g/dL (ref 12.0–15.0)
MCHC: 32.1 g/dL (ref 30.0–36.0)
MCV: 83.8 fl (ref 78.0–100.0)
Platelets: 435 K/uL — ABNORMAL HIGH (ref 150.0–400.0)
RBC: 5.43 Mil/uL — ABNORMAL HIGH (ref 3.87–5.11)
RDW: 15.5 % (ref 11.5–15.5)
WBC: 6 K/uL (ref 4.0–10.5)

## 2023-10-12 LAB — LIPID PANEL
Cholesterol: 153 mg/dL (ref 0–200)
HDL: 43.6 mg/dL (ref >39.00)
LDL Cholesterol: 98 mg/dL (ref 0–99)
NonHDL: 109.1
Total CHOL/HDL Ratio: 4
Triglycerides: 55 mg/dL (ref 0.0–149.0)
VLDL: 11 mg/dL (ref 0.0–40.0)

## 2023-10-12 NOTE — Progress Notes (Signed)
 Chief Complaint  Patient presents with   Annual Exam    CPE     Well Woman Regina Riley is here for a complete physical.   Her last physical was >1 year ago.  Current diet: in general, a healthy diet. Current exercise: none. Fatigue out of ordinary? No Seatbelt? Yes Advanced directive? No  Health Maintenance Pap/HPV- Yes Tetanus- Yes HIV screening- Yes Hep C screening- Yes  Past Medical History:  Diagnosis Date   Alopecia areata    Migraine    Obesity, Class II, BMI 35-39.9    Urticaria      Past Surgical History:  Procedure Laterality Date   COLPOSCOPY     WRIST SURGERY      Medications  Current Outpatient Medications on File Prior to Visit  Medication Sig Dispense Refill   albuterol  (VENTOLIN  HFA) 108 (90 Base) MCG/ACT inhaler Inhale 2 puffs into the lungs every 6 (six) hours as needed for wheezing or shortness of breath. 8 g 0   budeson-glycopyrrolate-formoterol (BREZTRI  AEROSPHERE) 160-9-4.8 MCG/ACT AERO inhaler Inhale 2 puffs into the lungs in the morning and at bedtime. 10.7 g 5   montelukast  (SINGULAIR ) 10 MG tablet TAKE 1 TABLET BY MOUTH EVERYDAY AT BEDTIME 90 tablet 1   norethindrone  (MICRONOR ) 0.35 MG tablet TAKE 1 TABLET BY MOUTH EVERY DAY 84 tablet 1   propranolol  (INDERAL ) 20 MG tablet Take 1-2 tablets (20-40 mg total) by mouth 3 (three) times daily. 30 tablet 1   venlafaxine  XR (EFFEXOR -XR) 150 MG 24 hr capsule Take 1 capsule (150 mg total) by mouth daily with breakfast. Needs appt 30 capsule 0    Allergies No Known Allergies  Review of Systems: Constitutional:  no unexpected weight changes Eye:  no recent significant change in vision Ear/Nose/Mouth/Throat:  Ears:  no tinnitus or vertigo and no recent change in hearing Nose/Mouth/Throat:  no complaints of nasal congestion, no sore throat Cardiovascular: no chest pain Respiratory:  no cough and no shortness of breath Gastrointestinal:  no abdominal pain, no change in bowel habits GU:   Female: negative for dysuria or pelvic pain Musculoskeletal/Extremities:  no pain of the joints Integumentary (Skin/Breast):  no abnormal skin lesions reported Neurologic:  no headaches Endocrine:  denies fatigue Hematologic/Lymphatic:  No areas of easy bleeding  Exam BP 128/74 (BP Location: Left Arm, Patient Position: Sitting)   Pulse 88   Temp 98 F (36.7 C) (Oral)   Resp 16   Ht 5' 5 (1.651 m)   Wt 246 lb 12.8 oz (111.9 kg)   SpO2 96%   BMI 41.07 kg/m  General:  well developed, well nourished, in no apparent distress Skin:  no significant moles, warts, or growths Head:  no masses, lesions, or tenderness Eyes:  pupils equal and round, sclera anicteric without injection Ears:  canals without lesions, TMs shiny without retraction, no obvious effusion, no erythema Nose:  nares patent, mucosa normal, and no drainage  Throat/Pharynx:  lips and gingiva without lesion; tongue and uvula midline; non-inflamed pharynx; no exudates or postnasal drainage Neck: neck supple without adenopathy, thyromegaly, or masses Lungs:  clear to auscultation, breath sounds equal bilaterally, no respiratory distress Cardio:  regular rate and rhythm, no bruits, no LE edema Abdomen:  abdomen soft, nontender; bowel sounds normal; no masses or organomegaly Genital: Defer to GYN Musculoskeletal:  symmetrical muscle groups noted without atrophy or deformity Extremities:  no clubbing, cyanosis, or edema, no deformities, no skin discoloration Neuro:  gait normal; deep tendon reflexes normal and symmetric Psych:  well oriented with normal range of affect and appropriate judgment/insight  Assessment and Plan  Well adult exam - Plan: CBC, Comprehensive metabolic panel with GFR, Lipid panel   Well 33 y.o. female. Counseled on diet and exercise. Advanced directive form provided today.  PCV20 today.  She will check with her GYN who she believes gave her the HPV vaccination series.  Flu shot to be done next  month.  Other orders as above. Follow up in 6 mo. The patient voiced understanding and agreement to the plan.  Mabel [place], DO 10/12/23 10:43 AM

## 2023-10-12 NOTE — Patient Instructions (Signed)
Give us 2-3 business days to get the results of your labs back.   Keep the diet clean and stay active.  Please consider adding some weight resistance exercise to your routine. Consider yoga as well.   Please get me a copy of your advanced directive form at your convenience.   Let us know if you need anything.  

## 2023-10-12 NOTE — Addendum Note (Signed)
 Addended by: DORLENE CHIQUITA RAMAN on: 10/12/2023 10:48 AM   Modules accepted: Orders

## 2023-12-14 ENCOUNTER — Telehealth: Admitting: Physician Assistant

## 2023-12-14 DIAGNOSIS — J069 Acute upper respiratory infection, unspecified: Secondary | ICD-10-CM | POA: Diagnosis not present

## 2023-12-14 MED ORDER — PROMETHAZINE-DM 6.25-15 MG/5ML PO SYRP: 5.0000 mL | ORAL_SOLUTION | Freq: Four times a day (QID) | ORAL | 0 refills | Status: AC | PRN

## 2023-12-14 NOTE — Progress Notes (Signed)

## 2023-12-14 NOTE — Addendum Note (Signed)
 Addended by: VIVIENNE DELON HERO on: 12/14/2023 12:52 PM   Modules accepted: Orders

## 2023-12-21 ENCOUNTER — Other Ambulatory Visit (HOSPITAL_BASED_OUTPATIENT_CLINIC_OR_DEPARTMENT_OTHER): Payer: Self-pay

## 2023-12-21 ENCOUNTER — Ambulatory Visit (HOSPITAL_BASED_OUTPATIENT_CLINIC_OR_DEPARTMENT_OTHER): Admitting: Pulmonary Disease

## 2023-12-21 ENCOUNTER — Encounter (HOSPITAL_BASED_OUTPATIENT_CLINIC_OR_DEPARTMENT_OTHER): Payer: Self-pay | Admitting: Pulmonary Disease

## 2023-12-21 VITALS — BP 123/81 | HR 75 | Ht 65.0 in | Wt 242.4 lb

## 2023-12-21 DIAGNOSIS — J4531 Mild persistent asthma with (acute) exacerbation: Secondary | ICD-10-CM

## 2023-12-21 MED ORDER — ALBUTEROL SULFATE HFA 108 (90 BASE) MCG/ACT IN AERS
2.0000 | INHALATION_SPRAY | Freq: Four times a day (QID) | RESPIRATORY_TRACT | 1 refills | Status: AC | PRN
  Filled 2023-12-21: qty 6.7, 25d supply, fill #0

## 2023-12-21 MED ORDER — PREDNISONE 10 MG PO TABS
ORAL_TABLET | ORAL | 0 refills | Status: AC
  Filled 2023-12-21: qty 20, 8d supply, fill #0

## 2023-12-21 NOTE — Patient Instructions (Signed)
 Mild persistent asthma - good control off inhalers Cough variant asthma --If symptoms restart, ok to use Breztri  2 puffs twice a day as needed --CONTINUE Albuterol  TWO puffs AS NEEDED for shortness of breath or wheezing

## 2023-12-21 NOTE — Progress Notes (Signed)
 Subjective:   PATIENT ID: Regina Riley GENDER: female DOB: 1990-06-15, MRN: [id number]  Chief Complaint  Patient presents with   Asthma    Reason for Visit: Follow-up chronic cough  Ms. Regina Riley is a 33 year old female never smoker with asthma and migraine who presents for follow-up for chronic cough.  Initial consult She was seen by her PCP, Dr. Frann on 07/05/22 for nonproductive cough associated with wheezing and shortness of breath. Reported possible mold exposure at work with worsening symptoms there. Denied reflux. Not on ACE inhibitor. Reported trial of Qvar without improvement. Started on Breo. Referred to Pulmonary for evaluation.  She reports the cough began around 10 months ago (October 2023) when she moved into a new building for work. She would have cough, headache and wheezing to the point when she felt like she had a constant viral illness. Cough is intermittently productive. Some question about ventilation in the building. She continues to work there and noticed when she was gone for week long vacations where her symptoms seemed to improve. Has taken Breo 100 for one month. Cough has improved by 75%. She reports work vents have been recently cleaned. Denies allergies or reflux issues. Did have have covid last month but this has resolved.  Denies childhood asthma however had pneumonia/bronchitis annually. No current pets. Previously had outdoor dog when she was child  11/03/22 Video Visit She reports cough is unchanged and occurs after exertion. But it is less productive since starting Breo. 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 Denies shortness of breath or wheezing.   05/19/23 Since our last visit she ran out of her Breo over one month and only taking albuterol  as needed. Has not picked up another inhaler due to cost, unclear if her insurance is no longer covering. Has not  needed it and avoiding mold and other triggers. Surprisingly the pollen is not too triggering. Denies shortness of breath, cough, wheezing, chest tightness. Denies nocturnal symptoms.   12/21/23 Since our last visit she reports a cold that began two weeks ago and treated with OTC and now doing better. Persistent nonproductive cough and residual hoarseness. She was prescribed by mychart with promethazine  cough syrup. Did not receive steroids. Prior to this illness she was doing well. She had not started inhalers  and had resolved symptoms after moving offices. Had stopped singulair . Denies fevers, chills.  Asthma Control Test ACT Total Score  05/19/2023  8:21 AM 24  09/22/2022  8:29 AM 19   Social History: [employer]  Past Medical History:  Diagnosis Date   Alopecia areata    Migraine    Obesity, Class II, BMI 35-39.9    Urticaria      Family History  Problem Relation Age of Onset   Asthma Mother    Hypertension Father    Hyperlipidemia Father    Diabetes Father    Heart attack Maternal Grandfather 84   Heart disease Paternal Grandfather      Social History   Occupational History   Not on file  Tobacco Use   Smoking status: Never    Passive exposure: Never   Smokeless tobacco: Never  Vaping Use   Vaping status: Never Used  Substance and Sexual Activity   Alcohol use: Not Currently   Drug use: Not Currently   Sexual activity: Yes    Birth control/protection: Pill  No Known Allergies   Outpatient Medications Prior to Visit  Medication Sig Dispense Refill   montelukast  (SINGULAIR ) 10 MG tablet TAKE 1 TABLET BY MOUTH EVERYDAY AT BEDTIME 90 tablet 1   norethindrone  (MICRONOR ) 0.35 MG tablet TAKE 1 TABLET BY MOUTH EVERY DAY 84 tablet 1   promethazine -dextromethorphan (PROMETHAZINE -DM) 6.25-15 MG/5ML syrup Take 5 mLs by mouth 4 (four) times daily as needed. 118 mL 0   propranolol  (INDERAL ) 20 MG tablet Take 1-2 tablets (20-40 mg total) by mouth  3 (three) times daily. 30 tablet 1   venlafaxine  XR (EFFEXOR -XR) 150 MG 24 hr capsule Take 1 capsule (150 mg total) by mouth daily with breakfast. Needs appt 30 capsule 0   albuterol  (VENTOLIN  HFA) 108 (90 Base) MCG/ACT inhaler Inhale 2 puffs into the lungs every 6 (six) hours as needed for wheezing or shortness of breath. 8 g 0   No facility-administered medications prior to visit.    Review of Systems  Constitutional:  Negative for chills, diaphoresis, fever, malaise/fatigue and weight loss.  HENT:  Negative for congestion.   Respiratory:  Positive for cough. Negative for hemoptysis, sputum production, shortness of breath and wheezing.   Cardiovascular:  Negative for chest pain, palpitations and leg swelling.     Objective:   Vitals:   12/21/23 0951  BP: 123/81  Pulse: 75  SpO2: 99%  Weight: 242 lb 6.4 oz (110 kg)  Height: 5' 5 (1.651 m)    SpO2: 99 %  Physical Exam: General: Well-appearing, no acute distress HENT: [place], AT Eyes: EOMI, no scleral icterus Respiratory: Clear to auscultation bilaterally.  No crackles, wheezing or rales Cardiovascular: RRR, -M/R/G, no JVD Extremities:-Edema,-tenderness Neuro: AAO x4, CNII-XII grossly intact Psych: Normal mood, normal affect  Data Reviewed:  Imaging: CT A/P 09/01/22 - Visualized lower lung fields clear. Normal parenchyma CXR 04/14/22 - No acute infiltrate effusion or edema  PFT: 10/29/22 FVC 3.12 (80%) FEV1 2.50 (77%) Ratio 80  TLC 86% DLCO 102%. Post BD FVC change - 17%. Post FEV1 change 20% Interpretation: Normal PFTs. Significant bronchodilator response consistent with asthma  Labs: CBC    Component Value Date/Time   WBC 6.0 10/12/2023 1050   RBC 5.43 (H) 10/12/2023 1050   HGB 14.6 10/12/2023 1050   HCT 45.5 10/12/2023 1050   PLT 435.0 (H) 10/12/2023 1050   MCV 83.8 10/12/2023 1050   MCH 27.0 09/01/2022 1833   MCHC 32.1 10/12/2023 1050   RDW 15.5 10/12/2023 1050   LYMPHSABS 3.2 01/15/2019 1623   MONOABS 0.4  01/15/2019 1623   EOSABS 0.0 01/15/2019 1623   BASOSABS 0.1 01/15/2019 1623   Absolute eos 01/15/19 - 0     Assessment & Plan:   Discussion: 33 year old female never smoker with asthma and migraine who presents for follow-up. Improved symptoms after location change off of inhalers. Has been compliant with ICS/LABA until last month. Reasonable to hold inhalers until symptoms recur. Her asthma may be seasonal and can restart as needed.   Mild persistent asthma with exacerbation Cough variant asthma --Prednisone  taper ordered --If symptoms restart, ok to use Breztri  2 puffs twice a day as needed --CONTINUE Albuterol  TWO puffs AS NEEDED for shortness of breath or wheezing   Health Maintenance Immunization History  Administered Date(s) Administered   Hep B, Unspecified 09/22/2000, 10/27/2000, 04/05/2001   Influenza-Unspecified 10/26/2019   PFIZER Comirnaty(Gray Top)Covid-19 Tri-Sucrose Vaccine 02/13/2020   PFIZER(Purple Top)SARS-COV-2 Vaccination 07/05/2019, 07/26/2019, 02/13/2020   PNEUMOCOCCAL CONJUGATE-20 10/12/2023   Pfizer Covid-19 [employer]  58yrs & up 11/14/2020   Tdap 03/02/2011, 08/04/2021   Varicella 12/25/2001   CT Lung Screen - never smoker. Not qualified  No orders of the defined types were placed in this encounter.  Meds ordered this encounter  Medications   albuterol  (VENTOLIN  HFA) 108 (90 Base) MCG/ACT inhaler    Sig: Inhale 2 puffs into the lungs every 6 (six) hours as needed for wheezing or shortness of breath.    Dispense:  6.7 g    Refill:  1   predniSONE  (DELTASONE ) 10 MG tablet    Sig: Take 4 tablets (40 mg total) by mouth daily with breakfast for 2 days, THEN 3 tablets (30 mg total) daily with breakfast for 2 days, THEN 2 tablets (20 mg total) daily with breakfast for 2 days, THEN 1 tablet (10 mg total) daily with breakfast for 2 days.    Dispense:  20 tablet    Refill:  0    Return in about 1 year (around 12/20/2024).   I have spent a  total time of 31-minutes on the day of the appointment including chart review, data review, collecting history, coordinating care and discussing medical diagnosis and plan with the patient/family. Past medical history, allergies, medications were reviewed. Pertinent imaging, labs and tests included in this note have been reviewed and interpreted independently by me.  Vernadine Coombs Slater Staff, MD [facility] 12/21/2023 11:57 AM

## 2024-04-10 ENCOUNTER — Ambulatory Visit: Admitting: Family Medicine
